# Patient Record
Sex: Female | Born: 1975 | Hispanic: No | Marital: Married | Smoking: Never smoker
Health system: Southern US, Community
[De-identification: ages and names within clinical notes are randomized; demographics above are authoritative.]

## PROBLEM LIST (undated history)

## (undated) DIAGNOSIS — D649 Anemia, unspecified: Secondary | ICD-10-CM

## (undated) HISTORY — DX: Anemia, unspecified: D64.9

---

## 1999-02-18 ENCOUNTER — Inpatient Hospital Stay (HOSPITAL_COMMUNITY): Admission: AD | Admit: 1999-02-18 | Discharge: 1999-02-18 | Payer: Self-pay | Admitting: Obstetrics & Gynecology

## 1999-03-02 ENCOUNTER — Ambulatory Visit (HOSPITAL_COMMUNITY): Admission: RE | Admit: 1999-03-02 | Discharge: 1999-03-02 | Payer: Self-pay | Admitting: Obstetrics and Gynecology

## 1999-07-04 ENCOUNTER — Inpatient Hospital Stay (HOSPITAL_COMMUNITY): Admission: AD | Admit: 1999-07-04 | Discharge: 1999-07-04 | Payer: Self-pay | Admitting: *Deleted

## 1999-07-04 ENCOUNTER — Inpatient Hospital Stay (HOSPITAL_COMMUNITY): Admission: AD | Admit: 1999-07-04 | Discharge: 1999-07-04 | Payer: Self-pay | Admitting: Obstetrics

## 1999-07-05 ENCOUNTER — Inpatient Hospital Stay (HOSPITAL_COMMUNITY): Admission: AD | Admit: 1999-07-05 | Discharge: 1999-07-09 | Payer: Self-pay | Admitting: Obstetrics

## 2002-02-08 ENCOUNTER — Encounter: Admission: RE | Admit: 2002-02-08 | Discharge: 2002-02-08 | Payer: Self-pay | Admitting: Family Medicine

## 2002-03-15 ENCOUNTER — Encounter: Admission: RE | Admit: 2002-03-15 | Discharge: 2002-03-15 | Payer: Self-pay | Admitting: Family Medicine

## 2002-05-03 ENCOUNTER — Encounter: Admission: RE | Admit: 2002-05-03 | Discharge: 2002-05-03 | Payer: Self-pay | Admitting: Family Medicine

## 2002-07-19 ENCOUNTER — Encounter: Admission: RE | Admit: 2002-07-19 | Discharge: 2002-07-19 | Payer: Self-pay | Admitting: Family Medicine

## 2002-08-24 ENCOUNTER — Encounter: Admission: RE | Admit: 2002-08-24 | Discharge: 2002-08-24 | Payer: Self-pay | Admitting: Family Medicine

## 2002-09-06 ENCOUNTER — Encounter: Admission: RE | Admit: 2002-09-06 | Discharge: 2002-09-06 | Payer: Self-pay | Admitting: Family Medicine

## 2002-10-11 ENCOUNTER — Encounter: Admission: RE | Admit: 2002-10-11 | Discharge: 2002-10-11 | Payer: Self-pay | Admitting: Family Medicine

## 2003-02-23 ENCOUNTER — Encounter: Admission: RE | Admit: 2003-02-23 | Discharge: 2003-02-23 | Payer: Self-pay | Admitting: Family Medicine

## 2003-03-07 ENCOUNTER — Encounter: Admission: RE | Admit: 2003-03-07 | Discharge: 2003-03-07 | Payer: Self-pay | Admitting: Family Medicine

## 2003-05-30 ENCOUNTER — Encounter: Admission: RE | Admit: 2003-05-30 | Discharge: 2003-05-30 | Payer: Self-pay | Admitting: Family Medicine

## 2003-09-05 ENCOUNTER — Encounter: Admission: RE | Admit: 2003-09-05 | Discharge: 2003-09-05 | Payer: Self-pay | Admitting: Family Medicine

## 2003-10-13 ENCOUNTER — Emergency Department (HOSPITAL_COMMUNITY): Admission: EM | Admit: 2003-10-13 | Discharge: 2003-10-13 | Payer: Self-pay | Admitting: Family Medicine

## 2003-10-18 ENCOUNTER — Emergency Department (HOSPITAL_COMMUNITY): Admission: EM | Admit: 2003-10-18 | Discharge: 2003-10-18 | Payer: Self-pay | Admitting: *Deleted

## 2004-01-13 ENCOUNTER — Ambulatory Visit: Payer: Self-pay | Admitting: Family Medicine

## 2004-01-16 ENCOUNTER — Encounter: Admission: RE | Admit: 2004-01-16 | Discharge: 2004-01-16 | Payer: Self-pay | Admitting: Sports Medicine

## 2004-02-02 ENCOUNTER — Ambulatory Visit: Payer: Self-pay | Admitting: Family Medicine

## 2004-09-03 ENCOUNTER — Ambulatory Visit: Payer: Self-pay | Admitting: Family Medicine

## 2005-01-31 ENCOUNTER — Ambulatory Visit: Payer: Self-pay | Admitting: Family Medicine

## 2005-08-30 ENCOUNTER — Encounter (INDEPENDENT_AMBULATORY_CARE_PROVIDER_SITE_OTHER): Payer: Self-pay | Admitting: *Deleted

## 2005-08-30 LAB — CONVERTED CEMR LAB

## 2005-09-16 ENCOUNTER — Ambulatory Visit: Payer: Self-pay | Admitting: Family Medicine

## 2005-12-16 ENCOUNTER — Ambulatory Visit: Payer: Self-pay | Admitting: Family Medicine

## 2005-12-30 ENCOUNTER — Ambulatory Visit: Payer: Self-pay | Admitting: Family Medicine

## 2006-02-07 ENCOUNTER — Ambulatory Visit: Payer: Self-pay | Admitting: Family Medicine

## 2006-05-30 ENCOUNTER — Encounter (INDEPENDENT_AMBULATORY_CARE_PROVIDER_SITE_OTHER): Payer: Self-pay | Admitting: *Deleted

## 2006-10-27 ENCOUNTER — Encounter (INDEPENDENT_AMBULATORY_CARE_PROVIDER_SITE_OTHER): Payer: Self-pay | Admitting: Family Medicine

## 2006-10-27 ENCOUNTER — Ambulatory Visit: Payer: Self-pay | Admitting: Family Medicine

## 2006-11-03 ENCOUNTER — Encounter (INDEPENDENT_AMBULATORY_CARE_PROVIDER_SITE_OTHER): Payer: Self-pay | Admitting: Family Medicine

## 2006-11-03 ENCOUNTER — Ambulatory Visit: Payer: Self-pay | Admitting: Sports Medicine

## 2006-11-03 LAB — CONVERTED CEMR LAB
ALT: 28 units/L (ref 0–35)
AST: 20 units/L (ref 0–37)
Albumin: 4.3 g/dL (ref 3.5–5.2)
Alkaline Phosphatase: 51 units/L (ref 39–117)
BUN: 13 mg/dL (ref 6–23)
CO2: 23 meq/L (ref 19–32)
Calcium: 8.9 mg/dL (ref 8.4–10.5)
Chloride: 106 meq/L (ref 96–112)
Cholesterol: 143 mg/dL (ref 0–200)
Creatinine, Ser: 0.65 mg/dL (ref 0.40–1.20)
Glucose, Bld: 89 mg/dL (ref 70–99)
HDL: 53 mg/dL (ref >39)
LDL Cholesterol: 76 mg/dL (ref 0–99)
Potassium: 4.4 meq/L (ref 3.5–5.3)
Sodium: 138 meq/L (ref 135–145)
Total Bilirubin: 0.6 mg/dL (ref 0.3–1.2)
Total CHOL/HDL Ratio: 2.7
Total Protein: 7.2 g/dL (ref 6.0–8.3)
Triglycerides: 68 mg/dL (ref <150)
VLDL: 14 mg/dL (ref 0–40)

## 2006-11-04 ENCOUNTER — Encounter (INDEPENDENT_AMBULATORY_CARE_PROVIDER_SITE_OTHER): Payer: Self-pay | Admitting: Family Medicine

## 2007-01-26 ENCOUNTER — Telehealth (INDEPENDENT_AMBULATORY_CARE_PROVIDER_SITE_OTHER): Payer: Self-pay | Admitting: *Deleted

## 2007-01-26 ENCOUNTER — Ambulatory Visit: Payer: Self-pay | Admitting: Sports Medicine

## 2008-01-13 ENCOUNTER — Encounter (INDEPENDENT_AMBULATORY_CARE_PROVIDER_SITE_OTHER): Payer: Self-pay | Admitting: Family Medicine

## 2008-02-01 ENCOUNTER — Encounter: Payer: Self-pay | Admitting: *Deleted

## 2008-05-09 ENCOUNTER — Encounter: Payer: Self-pay | Admitting: Family Medicine

## 2008-12-06 ENCOUNTER — Encounter: Payer: Self-pay | Admitting: Family Medicine

## 2008-12-06 ENCOUNTER — Ambulatory Visit: Payer: Self-pay | Admitting: Family Medicine

## 2008-12-06 LAB — CONVERTED CEMR LAB
Antibody Screen: NEGATIVE
Basophils Absolute: 0 10*3/uL (ref 0.0–0.1)
Basophils Relative: 0 % (ref 0–1)
Eosinophils Absolute: 0 10*3/uL (ref 0.0–0.7)
Eosinophils Relative: 1 % (ref 0–5)
HCT: 35.8 % — ABNORMAL LOW (ref 36.0–46.0)
Hemoglobin: 12.1 g/dL (ref 12.0–15.0)
Hepatitis B Surface Ag: NEGATIVE
Lymphocytes Relative: 22 % (ref 12–46)
Lymphs Abs: 1.7 10*3/uL (ref 0.7–4.0)
MCHC: 33.8 g/dL (ref 30.0–36.0)
MCV: 84 fL (ref 78.0–100.0)
Monocytes Absolute: 0.3 10*3/uL (ref 0.1–1.0)
Monocytes Relative: 4 % (ref 3–12)
Neutro Abs: 5.9 10*3/uL (ref 1.7–7.7)
Neutrophils Relative %: 74 % (ref 43–77)
Platelets: 197 10*3/uL (ref 150–400)
RBC: 4.26 M/uL (ref 3.87–5.11)
RDW: 13.6 % (ref 11.5–15.5)
Rh Type: POSITIVE
Rubella: 413.1 intl units/mL — ABNORMAL HIGH
Sickle Cell Screen: NEGATIVE
WBC: 8 10*3/uL (ref 4.0–10.5)

## 2008-12-07 ENCOUNTER — Encounter: Payer: Self-pay | Admitting: Family Medicine

## 2008-12-12 ENCOUNTER — Encounter: Payer: Self-pay | Admitting: Family Medicine

## 2008-12-12 ENCOUNTER — Ambulatory Visit: Payer: Self-pay | Admitting: Family Medicine

## 2008-12-12 LAB — CONVERTED CEMR LAB
Chlamydia, DNA Probe: NEGATIVE
GC Probe Amp, Genital: NEGATIVE

## 2008-12-14 ENCOUNTER — Encounter: Payer: Self-pay | Admitting: Family Medicine

## 2008-12-14 DIAGNOSIS — F330 Major depressive disorder, recurrent, mild: Secondary | ICD-10-CM

## 2008-12-28 ENCOUNTER — Ambulatory Visit: Payer: Self-pay | Admitting: Family Medicine

## 2008-12-30 ENCOUNTER — Telehealth: Payer: Self-pay | Admitting: Family Medicine

## 2009-01-04 ENCOUNTER — Encounter: Payer: Self-pay | Admitting: Family Medicine

## 2009-01-04 ENCOUNTER — Ambulatory Visit: Payer: Self-pay | Admitting: Family Medicine

## 2009-01-06 ENCOUNTER — Telehealth: Payer: Self-pay | Admitting: *Deleted

## 2009-01-09 ENCOUNTER — Telehealth: Payer: Self-pay | Admitting: *Deleted

## 2009-01-09 ENCOUNTER — Encounter: Payer: Self-pay | Admitting: Family Medicine

## 2009-01-23 ENCOUNTER — Ambulatory Visit: Payer: Self-pay | Admitting: Family Medicine

## 2009-01-23 ENCOUNTER — Encounter: Payer: Self-pay | Admitting: Family Medicine

## 2009-01-23 LAB — CONVERTED CEMR LAB
Blood in Urine, dipstick: NEGATIVE
Chlamydia, DNA Probe: NEGATIVE
Ketones, urine, test strip: NEGATIVE
Protein, U semiquant: NEGATIVE
Urobilinogen, UA: 0.2
WBC Urine, dipstick: NEGATIVE
Whiff Test: NEGATIVE

## 2009-01-24 ENCOUNTER — Encounter: Payer: Self-pay | Admitting: Family Medicine

## 2009-02-01 ENCOUNTER — Encounter: Payer: Self-pay | Admitting: Family Medicine

## 2009-02-09 ENCOUNTER — Encounter: Payer: Self-pay | Admitting: Family Medicine

## 2009-02-22 ENCOUNTER — Ambulatory Visit: Payer: Self-pay | Admitting: Family Medicine

## 2009-03-01 ENCOUNTER — Ambulatory Visit (HOSPITAL_COMMUNITY): Admission: RE | Admit: 2009-03-01 | Discharge: 2009-03-01 | Payer: Self-pay | Admitting: Family Medicine

## 2009-03-17 ENCOUNTER — Encounter: Payer: Self-pay | Admitting: Family Medicine

## 2009-03-21 ENCOUNTER — Ambulatory Visit: Payer: Self-pay | Admitting: Family Medicine

## 2009-04-10 ENCOUNTER — Encounter: Payer: Self-pay | Admitting: Family Medicine

## 2009-04-10 ENCOUNTER — Ambulatory Visit: Payer: Self-pay | Admitting: Family Medicine

## 2009-04-10 LAB — CONVERTED CEMR LAB
Hemoglobin: 11.4 g/dL — ABNORMAL LOW (ref 12.0–15.0)
RBC: 3.84 M/uL — ABNORMAL LOW (ref 3.87–5.11)
WBC: 11 10*3/uL — ABNORMAL HIGH (ref 4.0–10.5)

## 2009-04-20 ENCOUNTER — Ambulatory Visit: Payer: Self-pay | Admitting: Family Medicine

## 2009-05-04 ENCOUNTER — Ambulatory Visit: Payer: Self-pay | Admitting: Family Medicine

## 2009-05-17 ENCOUNTER — Ambulatory Visit: Payer: Self-pay | Admitting: Family Medicine

## 2009-05-17 LAB — CONVERTED CEMR LAB: Whiff Test: NEGATIVE

## 2009-05-31 ENCOUNTER — Encounter: Payer: Self-pay | Admitting: Family Medicine

## 2009-05-31 ENCOUNTER — Ambulatory Visit: Payer: Self-pay | Admitting: Family Medicine

## 2009-06-02 ENCOUNTER — Encounter: Payer: Self-pay | Admitting: *Deleted

## 2009-06-05 ENCOUNTER — Emergency Department (HOSPITAL_COMMUNITY): Admission: EM | Admit: 2009-06-05 | Discharge: 2009-06-05 | Payer: Self-pay | Admitting: Emergency Medicine

## 2009-06-07 ENCOUNTER — Ambulatory Visit: Payer: Self-pay

## 2009-06-07 ENCOUNTER — Encounter: Payer: Self-pay | Admitting: Family Medicine

## 2009-06-14 ENCOUNTER — Ambulatory Visit: Payer: Self-pay | Admitting: Family Medicine

## 2009-06-21 ENCOUNTER — Ambulatory Visit: Payer: Self-pay | Admitting: Family Medicine

## 2009-06-28 ENCOUNTER — Ambulatory Visit: Payer: Self-pay | Admitting: Family Medicine

## 2009-06-28 ENCOUNTER — Encounter: Payer: Self-pay | Admitting: Family Medicine

## 2009-06-29 ENCOUNTER — Ambulatory Visit: Payer: Self-pay | Admitting: Family Medicine

## 2009-06-29 ENCOUNTER — Inpatient Hospital Stay (HOSPITAL_COMMUNITY): Admission: AD | Admit: 2009-06-29 | Discharge: 2009-07-01 | Payer: Self-pay | Admitting: Obstetrics & Gynecology

## 2009-08-09 ENCOUNTER — Other Ambulatory Visit: Admission: RE | Admit: 2009-08-09 | Discharge: 2009-08-09 | Payer: Self-pay | Admitting: Family Medicine

## 2009-08-09 ENCOUNTER — Encounter: Payer: Self-pay | Admitting: Family Medicine

## 2009-08-09 ENCOUNTER — Ambulatory Visit: Payer: Self-pay | Admitting: Family Medicine

## 2009-08-09 LAB — CONVERTED CEMR LAB
Platelets: 245 10*3/uL (ref 150–400)
WBC: 8.2 10*3/uL (ref 4.0–10.5)

## 2009-08-10 LAB — CONVERTED CEMR LAB: Pap Smear: NEGATIVE

## 2009-11-17 ENCOUNTER — Emergency Department (HOSPITAL_COMMUNITY): Admission: EM | Admit: 2009-11-17 | Discharge: 2009-11-17 | Payer: Self-pay | Admitting: Family Medicine

## 2009-11-21 ENCOUNTER — Ambulatory Visit: Payer: Self-pay | Admitting: Family Medicine

## 2009-12-11 ENCOUNTER — Telehealth: Payer: Self-pay | Admitting: Family Medicine

## 2009-12-14 ENCOUNTER — Ambulatory Visit: Payer: Self-pay | Admitting: Family Medicine

## 2009-12-14 DIAGNOSIS — N949 Unspecified condition associated with female genital organs and menstrual cycle: Secondary | ICD-10-CM

## 2009-12-14 LAB — CONVERTED CEMR LAB
Beta hcg, urine, semiquantitative: NEGATIVE
Hemoglobin: 11.7 g/dL

## 2009-12-26 ENCOUNTER — Telehealth: Payer: Self-pay | Admitting: *Deleted

## 2010-05-01 NOTE — Assessment & Plan Note (Signed)
Summary: ob visit/eo: 37 5/7   Vital Signs:  Patient profile:   35 year old female Weight:      145.44 pounds BP sitting:   108 / 72  (left arm)  Vitals Entered ByAngeline Slim MD (June 14, 2009 3:10 PM) CC: 37 5/7 OB check Is Patient Diabetic? No Pain Assessment Patient in pain? no        Primary Care Provider:  Dasia Guerrier MD  CC:  37 5/7 OB check.  History of Present Illness: 35 y/o G3P1011 at 88 5/7 wga by LMP and 19 wga U/S with [facility] 06/30/09.   U/S at 49 wga showed that EFW is 41%, with normal placenta.   Habits & Providers  Alcohol-Tobacco-Diet     Tobacco Status: never     Cigarette Packs/Day: no  Allergies: No Known Drug Allergies   Impression & Recommendations:  Problem # 1:  PREGNANCY, NORMAL (ICD-V22.2) Assessment Unchanged  35 y/o G3P1011 at 9 5/7 wga by LMP and 19 wga U/S with [facility] 06/30/09.   U/S at 7 wga showed that EFW is 41%, with normal placenta. Birth control: pt thinks either Depo.  No Epidural.  No IV pain meds.  Baby will be followed by [facility] since her other daughter, Steward Drone, is seen by them.  Instructions given for going to MAU.    Pt to be seen in 1 wks with me or Dr Gwendolyn Grant.  Records faxed to [facility] with GBS negative.  Cervix: thick/closed/high vertex.  Ultrasound for position: vertex.   Labs:  A+/Ab neg, [place] neg, HIV NR, Hep B neg, RPR NR, Rubella IMM, 12/06/08 H/H 12.1/35.8, 12/12/08  GC/Chlam neg.  Early 1hr glucola (01/04/09) 145, 3 hr glucola 83, 98, 106, 114.  Repeat 28 wk 3-hr glucola 03/10/10 86 (fasting), 164, 161, 104.  GBS Negabie 05/31/09.  Orders: Other OB visit- [facility] ([facility]BCK)  Complete Medication List: 1)  P D Natal Vitamins/folic Acid Tabs (Prenatal multivit-min-fe-fa) .... One tablet by mouth daily. please label in spanish 2)  Ranitidine Hcl 150 Mg Caps (Ranitidine hcl) .... One tablet by mouth daily for heartburn 3)  Ondansetron Hcl 4 Mg Tabs (Ondansetron hcl) .... One tablet by mouth every 8 hours for nausea 4)  Unisom 25 Mg Tabs (Doxylamine  succinate (sleep)) .... One tablet by mouth three times a day for nausea.  please label in spanish 5)  Vitamin B-6 100 Mg Tabs (Pyridoxine hcl) .... One tablet by mouth three times a day for nausea.  take with unisom.  please label in spanish  Patient Instructions: 1)  Please schedule a follow-up appointment in 1 week for OB  2)  Dr Mack Hook pager [phone].  If you are admitted to [facility] hospital please ask the nurse to call me. 3)  Kick Counts:  If the baby is not moving as much as normal, then count for 1 hr.  If less than 5 movements in one hour, then drink water, lay down for 30 minutes.  Then count for 2 hrs, should move at least 10 times.  If not go to [facility] hospital. 4)  Go to [facility] hospital for bleeding or water breaking.  5)  I want you to drink plenty of fluids.     Flowsheet View for Follow-up Visit    Estimated weeks of       gestation:     37 5/7    Weight:     145.44    Blood pressure:   108 / 72  Hx headache?     No    Nausea/vomiting?   No    Edema?     0    Bleeding?     no    Leakage/discharge?   no    Fetal activity:       yes    Labor symptoms?   few ctx    Fundal height:      37    FHR:       140s    Fetal position:      vertex    Cx dilation:     0    Cx effacement:   0    Fetal station:     high    Taking Vitamins?   Y    Smoking PPD:   no    Comment:     some nausea, started yesterday, some back pain.     Next visit:     1 wk    Resident:     Pope Brunty    Preceptor:     Sheffield Slider t

## 2010-05-01 NOTE — Assessment & Plan Note (Signed)
Summary: OB F/U [facility]  29 6/7   Vital Signs:  Patient profile:   35 year old female Height:      61.75 inches Weight:      133 pounds BMI:     24.61 Pulse rate:   94 / minute BP sitting:   108 / 69  (left arm) Cuff size:   regular  Vitals Entered By: Tessie Fass CMA (April 20, 2009 2:05 PM) CC: OB visit   Primary Care Provider:  Trung Wenzl MD  CC:  OB visit.  History of Present Illness: 35 y/o G3P1011 at 45 6/7 wga by [facility] and 93 wga [place] with [facility] 06/30/09.    Habits & Providers  Alcohol-Tobacco-Diet     Cigarette Packs/Day: no  Medications Prior to Update: 1)  P D Natal Vitamins/folic Acid  Tabs (Prenatal Multivit-Min-Fe-Fa) .... One Tablet By Mouth Daily. Please Label in Spanish 2)  Ranitidine Hcl 150 Mg Caps (Ranitidine Hcl) .... One Tablet By Mouth Daily For Heartburn 3)  Ondansetron Hcl 4 Mg Tabs (Ondansetron Hcl) .... One Tablet By Mouth Every 8 Hours For Nausea 4)  Unisom 25 Mg Tabs (Doxylamine Succinate (Sleep)) .... One Tablet By Mouth Three Times A Day For Nausea.  Please Label in Spanish 5)  Vitamin B-6 100 Mg Tabs (Pyridoxine Hcl) .... One Tablet By Mouth Three Times A Day For Nausea.  Take With Unisom.  Please Label in Spanish  Current Medications (verified): 1)  P D Natal Vitamins/folic Acid  Tabs (Prenatal Multivit-Min-Fe-Fa) .... One Tablet By Mouth Daily. Please Label in Spanish 2)  Ranitidine Hcl 150 Mg Caps (Ranitidine Hcl) .... One Tablet By Mouth Daily For Heartburn 3)  Ondansetron Hcl 4 Mg Tabs (Ondansetron Hcl) .... One Tablet By Mouth Every 8 Hours For Nausea 4)  Unisom 25 Mg Tabs (Doxylamine Succinate (Sleep)) .... One Tablet By Mouth Three Times A Day For Nausea.  Please Label in Spanish 5)  Vitamin B-6 100 Mg Tabs (Pyridoxine Hcl) .... One Tablet By Mouth Three Times A Day For Nausea.  Take With Unisom.  Please Label in Spanish  Allergies (verified): No Known Drug Allergies  Past History:  Past Medical History: Last updated: 10/27/2006 Breast lump  - 611.72 - now gone   Past Surgical History: Last updated: 10/27/2006 IUD Mirena - 08/31/2002, partial removal ingrown toe nail - 03/10/2003, TAB - 05/02/2002  Family History: Last updated: 12/12/2008 Father - 87 yo, prostate disease (not CA) otherwise well,  Mother - deceased 16 yo, TB, hepatitis Brothers- DM, brother  Social History: lives with the father of her daughter([name]) born 4/05.; Native of [place]; Boyfriend is a Corporate investment banker. Works at [employer] at [employer]; No etoh, no tob.no drugs.    Impression & Recommendations:  Problem # 1:  PREGNANCY, NORMAL (ICD-V22.2) Assessment Unchanged  35 y/o G3P1011 at 63 6/7 wga by LMP and 19 wga U/S with [facility] 06/30/08.   U/S at 86 wga showed that EFW is 41%, with normal placenta.  Pt has not decided about birth control.  No Epidural.  No IV pain meds.  Baby will be followed by [facility] since her other daughter, Steward Drone, is seen by them.  Pt to be seen in [facility] clinic or with me next 1-2 wks.    Labs:  A+/Ab neg, [place] neg, HIV NR, Hep B neg, RPR NR, Rubella IMM, 12/06/08 H/H 12.1/35.8, 12/12/08  GC/Chlam neg.  Early 1hr glucola (01/04/09) 145, 3 hr glucola 83, 98, 106, 114.  Repeat 28  wk 3-hr glucola 03/10/10 86 (fasting), 164, 161, 104  Orders: Other OB visit- [facility] (OBCK)  Complete Medication List: 1)  P D Natal Vitamins/folic Acid Tabs (Prenatal multivit-min-fe-fa) .... One tablet by mouth daily. please label in spanish 2)  Ranitidine Hcl 150 Mg Caps (Ranitidine hcl) .... One tablet by mouth daily for heartburn 3)  Ondansetron Hcl 4 Mg Tabs (Ondansetron hcl) .... One tablet by mouth every 8 hours for nausea 4)  Unisom 25 Mg Tabs (Doxylamine succinate (sleep)) .... One tablet by mouth three times a day for nausea.  please label in spanish 5)  Vitamin B-6 100 Mg Tabs (Pyridoxine hcl) .... One tablet by mouth three times a day for nausea.  take with unisom.  please label in spanish  Patient Instructions: 1)  Please schedule a follow-up  appointment in 2 weeks with OB clinic or Dr Janalyn Harder.  2)  Dr Mack Hook pager [phone].  If you are admitted to [facility] hospital please ask the nurse to call me. 3)  Kick Counts:  If the baby is not moving as much as normal, then count for 1 hr.  If less than 5 movements in one hour, then drink water, lay down for 30 minutes.  Then count for 2 hrs, should move at least 10 times.  If not go to [facility] hospital. 4)  Go to [facility] hospital for bleeding or water breaking.  5)  I want you to drink plenty of fluids.   Prescriptions: P D NATAL VITAMINS/FOLIC ACID  TABS (PRENATAL MULTIVIT-MIN-FE-FA) one tablet by mouth daily. please label in Spanish  #34 x 12   Entered and Authorized by:   Angeline Slim MD   Signed by:   Angeline Slim MD on 04/20/2009   Method used:   Electronically to        [facility] [id number]* (retail)       [address]       [place], [name]  [address]       Ph: [phone]       Fax: [phone]   RxID:   [id number]    OB Initial Intake Information    Positive HCG by: self, health dept    Race: Other    Marital status: Married    Occupation: outside work    Type of work: Chief Technology Officer, 4 days/wk    Education (last grade completed): 12    Number of children at home: 1    [facility]ospital [facility] delivery[facility]    Newborn's physician: [facility] [address]  FOB Information    Husband/Father of baby: Drucilla Chalet    FOB occupation Holiday representative    Phone: [phone]    FOB Comments: FOB is not husband, but in stable relationship.  Menstrual History    LMP (date): 09/23/2008    LMP - Character: normal    Menarche: 16 years    Menses interval: 30 days    Menstrual flow 4-5 days    On BCP's at conception: no    Date of positive (+) home preg. test: 11/04/2008   Flowsheet View for Follow-up Visit    Estimated weeks of       gestation:     29 6/7    Weight:     133    Blood pressure:   108 / 69    Headache:     No    Nausea/vomiting:   No    Edema:     0    Vaginal  bleeding:   no    Vaginal discharge:   no    Fundal height:      27    FHR:       150s    Fetal activity:     yes    Labor symptoms:   no    Taking prenatal vits?   Y    Smoking:     no    Next visit:     2 wk    Resident:     Makiya Jeune    Preceptor:     Mauricio Po    Comment:     OB clinic next week, then to see me again.    Flowsheet View for Follow-up Visit    Estimated weeks of       gestation:     29 6/7    Weight:     133    Blood pressure:   108 / 69    Hx headache?     No    Nausea/vomiting?   No    Edema?     0    Bleeding?     no    Leakage/discharge?   no    Fetal activity:       yes    Labor symptoms?   no    Fundal height:      27    FHR:       150s    Taking Vitamins?   Y    Smoking PPD:   no    Comment:     OB clinic next week, then to see me again.    Next visit:     2 wk    Resident:     Benjamyn Hestand    Preceptor:     Montel Culver View for Follow-up Visit    Estimated weeks of       gestation:     29 6/7    Weight:     133    Blood pressure:   108 / 69    Hx headache?     No    Nausea/vomiting?   No    Edema?     0    Bleeding?     no    Leakage/discharge?   no    Fetal activity:       yes    Labor symptoms?   no    Fundal height:      27    FHR:       150s    Taking Vitamins?   Y    Smoking PPD:   no    Comment:     OB clinic next week, then to see me again.    Next visit:     2 wk    Resident:     Jossie Smoot    Preceptor:     Mauricio Po

## 2010-05-01 NOTE — Assessment & Plan Note (Signed)
Summary: ob visit/eo: 35 5/7   Vital Signs:  Patient profile:   35 year old female Weight:      141 pounds Pulse rate:   86 / minute BP sitting:   114 / 74  (left arm) Cuff size:   regular  Vitals Entered By: Tessie Fass CMA (May 31, 2009 2:56 PM) CC: OB Visit   Primary Care Provider:  Angeline Slim MD  CC:  OB Visit.  History of Present Illness: 35 y/o G3P1011 at 52 5/7 wga with [facility] 06/30/09.   Habits & Providers  Alcohol-Tobacco-Diet     Cigarette Packs/Day: no  Allergies: No Known Drug Allergies  Past History:  Past Medical History: Last updated: 10/27/2006 Breast lump - 611.72 - now gone   Past Surgical History: Last updated: 10/27/2006 IUD Mirena - 08/31/2002, partial removal ingrown toe nail - 03/10/2003, TAB - 05/02/2002  Family History: Last updated: 12/12/2008 Father - 92 yo, prostate disease (not CA) otherwise well,  Mother - deceased 87 yo, TB, hepatitis Brothers- DM, brother  Social History: Last updated: 04/20/2009 lives with the father of her daughter([name]) born 4/05.; Native of [place]; Boyfriend is a Corporate investment banker. Works at [employer] at [employer]; No etoh, no tob.no drugs.    Impression & Recommendations:  Problem # 1:  PREGNANCY, NORMAL (ICD-V22.2) Assessment Unchanged 35 y/o G3P1011 at 37 5/7 wga by LMP and 19 wga U/S with [facility] 06/30/09.   U/S at 71 wga showed that EFW is 41%, with normal placenta. Birth control: pt thinks either Depo or IUD.  No Epidural.  No IV pain meds.  Baby will be followed by [facility] since her other daughter, Steward Drone, is seen by them. GBS done today.  Did not repeat CG/Chlam as pt had friable cervix previously and I think her risks are low.  Instructions given for going to MAU.  Will try ranitidine for reflux symptioms.    Pt to be seen in 1 wks with me or Dr Gwendolyn Grant.  Will fax records to [facility] once we have GBS result.  Labs:  A+/Ab neg, [place] neg, HIV NR, Hep B neg, RPR NR, Rubella IMM, 12/06/08 H/H 12.1/35.8, 12/12/08   GC/Chlam neg.  Early 1hr glucola (01/04/09) 145, 3 hr glucola 83, 98, 106, 114.  Repeat 28 wk 3-hr glucola 03/10/10 86 (fasting), 164, 161, 104  Orders: Grp B Probe-[facility] ([id number]) Other OB visit- [facility] (OBCK)  Complete Medication List: 1)  P D Natal Vitamins/folic Acid Tabs (Prenatal multivit-min-fe-fa) .... One tablet by mouth daily. please label in spanish 2)  Ranitidine Hcl 150 Mg Caps (Ranitidine hcl) .... One tablet by mouth daily for heartburn 3)  Ondansetron Hcl 4 Mg Tabs (Ondansetron hcl) .... One tablet by mouth every 8 hours for nausea 4)  Unisom 25 Mg Tabs (Doxylamine succinate (sleep)) .... One tablet by mouth three times a day for nausea.  please label in spanish 5)  Vitamin B-6 100 Mg Tabs (Pyridoxine hcl) .... One tablet by mouth three times a day for nausea.  take with unisom.  please label in spanish  Patient Instructions: 1)  Please schedule a follow-up appointment in 1 week with Dr Janalyn Harder or Dr Gwendolyn Grant.  2)  Dr Mack Hook pager [phone].  If you are admitted to [facility] hospital please ask the nurse to call me. 3)  Kick Counts:  If the baby is not moving as much as normal, then count for 1 hr.  If less than 5 movements in one hour, then drink water,  lay down for 30 minutes.  Then count for 2 hrs, should move at least 10 times.  If not go to [facility] hospital. 4)  Go to [facility] hospital for bleeding or water breaking.  5)  I want you to drink plenty of fluids.   6)  Take Ranitidine for reflux.     OB Initial Intake Information    Positive HCG by: self, health dept    Race: Other    Marital status: Married    Occupation: outside work    Type of work: Chief Technology Officer, 4 days/wk    Education (last grade completed): 12    Number of children at home: 1    [facility] delivery: [facility]    Newborn's physician: [facility] [address]  FOB Information    Husband/Father of baby: Drucilla Chalet    FOB occupation Holiday representative    Phone: [phone]    FOB Comments: FOB is not  husband, but in stable relationship.  Menstrual History    LMP (date): 09/23/2008    LMP - Character: normal    Menarche: 16 years    Menses interval: 30 days    Menstrual flow 4-5 days    On BCP's at conception: no    Date of positive (+) home preg. test: 11/04/2008   Flowsheet View for Follow-up Visit    Estimated weeks of       gestation:     35 5/7    Weight:     141    Blood pressure:   114 / 74    Headache:     No    Nausea/vomiting:   No    Edema:     TrLE    Vaginal bleeding:   no    Vaginal discharge:   some    Fundal height:      34    FHR:       140s    Fetal activity:     yes    Labor symptoms:   no    Taking prenatal vits?   Y    Smoking:     no    Next visit:     1 wk    Resident:     Chamberlain Steinborn    Preceptor:     [place]    Comment:     lots of acid reflux. has not tried taking ranitidine.     Flowsheet View for Follow-up Visit    Estimated weeks of       gestation:     35 5/7    Weight:     141    Blood pressure:   114 / 74    Hx headache?     No    Nausea/vomiting?   No    Edema?     TrLE    Bleeding?     no    Leakage/discharge?   some    Fetal activity:       yes    Labor symptoms?   no    Fundal height:      34    FHR:       140s    Taking Vitamins?   Y    Smoking PPD:   no    Comment:     lots of acid reflux. has not tried taking ranitidine.     Next visit:     1 wk    Resident:     Jamilyn Pigeon  Preceptor:     Pearletha Forge

## 2010-05-01 NOTE — Assessment & Plan Note (Signed)
Summary: OB/KH   Vital Signs:  Patient profile:   35 year old female Weight:      147.2 pounds BP sitting:   128 / 82  Vitals Entered By: Temple Pacini  Primary Care Provider:  Angeline Slim MD   History of Present Illness: 35 y/o G3P1011 at 22 5/7 wga by LMP and 19 wga U/S with [facility] 06/30/09.    BPP + NST set up for 07/03/09.  Habits & Providers  Alcohol-Tobacco-Diet     Cigarette Packs/Day: ni  Allergies: No Known Drug Allergies  Social History: Packs/Day:  ni   Impression & Recommendations:  Problem # 1:  PREGNANCY, NORMAL (ICD-V22.2) Assessment Unchanged 35 y/o G3P1011 at 24 5/7 wga by LMP and 19 wga U/S with [facility] 06/30/09.   U/S at 23 wga showed that EFW is 41%, with normal placenta. Birth control: pt thinks either Depo.  No Epidural.  No IV pain meds.  Baby will be followed by [facility] since her other daughter, Steward Drone, is seen by them.  Instructions given for going to [facility]U.    BPP + NST set up for 07/05/09 (no appts avail sooner due to wknd).  Will set up date for induction at 25 wga if still pregnant.   Pt to be seen in 1 wks with me or Dr Gwendolyn Grant.  Records faxed to [facility] with GBS negative.  Cervix: 1.5/30%/-3/vertex.  Ultrasound for position: vertex.   Labs:  A+/Ab neg, [place] neg, HIV NR, Hep B neg, RPR NR, Rubella IMM, 12/06/08 H/H 12.1/35.8, 12/12/08  GC/Chlam neg.  Early 1hr glucola (01/04/09) 145, 3 hr glucola 83, 98, 106, 114.  Repeat 28 wk 3-hr glucola 03/10/10 86 (fasting), 164, 161, 104.  GBS Negative 05/31/09.  Orders: Other OB visit- [facility] (OBCK)  Complete Medication List: 1)  P D Natal Vitamins/folic Acid Tabs (Prenatal multivit-min-fe-fa) .... One tablet by mouth daily. please label in spanish 2)  Ranitidine Hcl 150 Mg Caps (Ranitidine hcl) .... One tablet by mouth daily for heartburn 3)  Ondansetron Hcl 4 Mg Tabs (Ondansetron hcl) .... One tablet by mouth every 8 hours for nausea 4)  Unisom 25 Mg Tabs (Doxylamine succinate (sleep)) .... One tablet by mouth three times a day for  nausea.  please label in spanish 5)  Vitamin B-6 100 Mg Tabs (Pyridoxine hcl) .... One tablet by mouth three times a day for nausea.  take with unisom.  please label in spanish  Patient Instructions: 1)  Please schedule a follow-up appointment in 1 weeks if still pregnant.  2)  Dr Mack Hook pager [phone].  If you are admitted to [facility] hospital please ask the nurse to call me. 3)  Kick Counts:  If the baby is not moving as much as normal, then count for 1 hr.  If less than 5 movements in one hour, then drink water, lay down for 30 minutes.  Then count for 2 hrs, should move at least 10 times.  If not go to [facility] hospital. 4)  Go to [facility] hospital for bleeding or water breaking.  5)  I want you to drink plenty of fluids.   6)  Keep appointment on 4/6 for ultrasound.   Flowsheet View for Follow-up Visit    Estimated weeks of       gestation:     39 5/7    Weight:     147.2    Blood pressure:   128 / 82    Hx headache?     No  Nausea/vomiting?   No    Edema?     TrLE    Bleeding?     no    Leakage/discharge?   no    Fetal activity:       yes    Labor symptoms?   few ctx    Fundal height:      38.5    FHR:       140s    Fetal position:      vertex    Cx dilation:     1.5    Cx effacement:   30%    Fetal station:     -3    Taking Vitamins?   Y    Smoking PPD:   ni    Comment:     +crampy abd pain. loss of appetite.  back pain.     Next visit:     1 wk    Resident:     Mahitha Hickling    Preceptor:     Pearletha Forge

## 2010-05-01 NOTE — Assessment & Plan Note (Signed)
Summary: menses concerns & needs contraception/[place]/ta   Vital Signs:  Patient profile:   35 year old female Height:      61.75 inches Weight:      120 pounds BMI:     22.21 Temp:     97.8 degrees F oral Pulse rate:   69 / minute BP sitting:   100 / 66  (left arm) Cuff size:   regular  Vitals Entered By: Tessie Fass CMA (December 14, 2009 4:17 PM) CC: irregular menses Pain Assessment Patient in pain? no        Primary Care Chauntelle Azpeitia:  Cat Ta MD  CC:  irregular menses.  History of Present Illness: 35 y/o G3P2012 presents for irregular menses.   She delivered her baby on March 31 and received depo injection prior to hospital discharge.  She has been breastfeeding and not using any other forms of birth control.  She has not had a menstrual cycle until mid Aug and is still bleeding (almost 4 wks).  States that cycle is light now.  She is changing pads only 2x/day.  No fever, chills, abd pain, vaginal pain, pain with intercourse, weakness, dysnpea, abnormal weight loss.  (lost 4 lbs in last 5 months).    Current Medications (verified): 1)  P D Natal Vitamins/folic Acid  Tabs (Prenatal Multivit-Min-Fe-Fa) .... One Tablet By Mouth Daily. Please Label in Spanish 2)  Premarin 1.25 Mg Tabs (Estrogens Conjugated) .Marland Kitchen.. 1 Tab By Mouth Daily X 7 Days  Allergies (verified): No Known Drug Allergies  Review of Systems       per hpi   Physical Exam  General:  Well-developed,well-nourished,in no acute distress; alert,appropriate and cooperative throughout examination Genitalia:  Normal introitus for age, no external lesions, no vaginal discharge, mucosa pink and moist, no vaginal or cervical lesions, no vaginal atrophy, no friaility or hemorrhage, normal uterus size and position, no adnexal masses or tenderness. Minimal bleeding in vault.  no inflammation.    Impression & Recommendations:  Problem # 1:  MENORRHAGIA, PERIMENOPAUSAL (ICD-626.8) Assessment New Hb 11.7.  Pt has no  complaints of fatigue, sob, dizziness, lightheadedness.  Exam wnl.  Irregular menses likely from Depo.  Pt has not had another injection since April.  We discussed treatment of options: do nothing, or stop the bleeding with oral hormone.  Pt states that she will wait another weeka and if still bleeding will take med.  Will rx Premarin 1.25mg  daily x 7 days (rec by Uptodate as pt had Depo).  Orders: Hemoglobin-[facility] ([id number]) [facility]- Est Level  3 ([id number])  Complete Medication List: 1)  P D Natal Vitamins/folic Acid Tabs (Prenatal multivit-min-fe-fa) .... One tablet by mouth daily. please label in spanish 2)  Premarin 1.25 Mg Tabs (Estrogens conjugated) .Marland Kitchen.. 1 tab by mouth daily x 7 days  Other Orders: U Preg-[facility] ([id number])  Patient Instructions: 1)  Premarin once a day for 7 days to stop the bleeding.  Prescriptions: PREMARIN 1.25 MG TABS (ESTROGENS CONJUGATED) 1 tab by mouth daily x 7 days  #7 x 0   Entered and Authorized by:   Angeline Slim MD   Signed by:   Angeline Slim MD on 12/14/2009   Method used:   Electronically to        [facility] [phone]* (retail)       [address]       [place], [name]  [address]       Ph: [phone]       Fax: [phone]  RxID:   [id number]   Laboratory Results   Urine Tests  Date/Time Received: December 14, 2009 4:24 PM  Date/Time Reported: December 14, 2009 4:29 PM     Urine HCG: negative Comments: ...........test performed by...........Marland KitchenTerese Door, CMA   Blood Tests   Date/Time Received: December 14, 2009 4:58 PM   Date/Time Reported: December 14, 2009 5:00 PM     CBC   HGB:  11.7 g/dL   (Normal Range: 14.7-82.9 in Males, 12.0-15.0 in Females) Comments: ...........test performed by...........Marland KitchenTerese Door, CMA

## 2010-05-01 NOTE — Assessment & Plan Note (Signed)
Summary: OB F/U [facility]: 38 5/7   Vital Signs:  Patient profile:   35 year old female Height:      61.75 inches Weight:      145 pounds Pulse rate:   83 / minute BP sitting:   124 / 77  (left arm) Cuff size:   regular  Vitals Entered By: Tessie Fass CMA (June 21, 2009 1:48 PM) CC: OB visit   Primary Care Provider:  Rigel Filsinger MD  CC:  OB visit.  History of Present Illness: 35 y/o G3P1011 at 37 5/7 wga by LMP and 19 wga U/S with [facility] 06/30/09.   U/S at 37 wga showed that EFW is 41%, with normal placenta.  Habits & Providers  Alcohol-Tobacco-Diet     Cigarette Packs/Day: no  Allergies: No Known Drug Allergies   Impression & Recommendations:  Problem # 1:  PREGNANCY, NORMAL (ICD-V22.2) Assessment Unchanged  35 y/o G3P1011 at 77 5/7 wga by LMP and 19 wga U/S with [facility] 06/30/09.   U/S at 63 wga showed that EFW is 41%, with normal placenta. Birth control: pt thinks either Depo.  No Epidural.  No IV pain meds.  Baby will be followed by [facility] since her other daughter, Steward Drone, is seen by them.  Instructions given for going to [facility].    Pt to be seen in 1 wks with me or Dr Gwendolyn Grant.  Records faxed to [facility] with GBS negative.  Cervix: thick/closed/high vertex.  Ultrasound for position: vertex.   Labs:  A+/Ab neg, [place] neg, HIV NR, Hep B neg, RPR NR, Rubella IMM, 12/06/08 H/H 12.1/35.8, 12/12/08  GC/Chlam neg.  Early 1hr glucola (01/04/09) 145, 3 hr glucola 83, 98, 106, 114.  Repeat 28 wk 3-hr glucola 03/10/10 86 (fasting), 164, 161, 104.  GBS [place] 05/31/09.  Orders: Other OB visit- [facility] ([facility]K)  Complete Medication List: 1)  P D Natal Vitamins/folic Acid Tabs (Prenatal multivit-min-fe-fa) .... One tablet by mouth daily. please label in spanish 2)  Ranitidine Hcl 150 Mg Caps (Ranitidine hcl) .... One tablet by mouth daily for heartburn 3)  Ondansetron Hcl 4 Mg Tabs (Ondansetron hcl) .... One tablet by mouth every 8 hours for nausea 4)  Unisom 25 Mg Tabs (Doxylamine succinate (sleep)) .... One tablet by  mouth three times a day for nausea.  please label in spanish 5)  Vitamin B-6 100 Mg Tabs (Pyridoxine hcl) .... One tablet by mouth three times a day for nausea.  take with unisom.  please label in spanish  Patient Instructions: 1)  Please schedule a follow-up appointment in 1 week with Dr Janalyn Harder or Gwendolyn Grant.  2)  Dr Mack Hook pager [phone].  If you are admitted to [facility] hospital please ask the nurse to call me. 3)  Kick Counts:  If the baby is not moving as much as normal, then count for 1 hr.  If less than 5 movements in one hour, then drink water, lay down for 30 minutes.  Then count for 2 hrs, should move at least 10 times.  If not go to [facility] hospital. 4)  Go to [facility] hospital for bleeding or water breaking.  5)  I want you to drink plenty of fluids.   6)  Depo $139 every 3 months. 7)  [facility] card does not pay for this.   Flowsheet View for Follow-up Visit    Estimated weeks of       gestation:     38 5/7    Weight:  145    Blood pressure:   124 / 77    Hx headache?     No    Nausea/vomiting?   No    Edema?     TrLE    Bleeding?     no    Leakage/discharge?   no    Fetal activity:       yes    Labor symptoms?   few ctx    Fundal height:      37.5    FHR:       150s    Taking Vitamins?   Y    Smoking PPD:   no    Comment:     Vomited one time today after having lunch with friend to had on a strong perfume.  No fever.  Did not sleep at all last night because her daughter was sick.    Next visit:     1 wk    Resident:     Alberto Pina    Preceptor:     Pearletha Forge

## 2010-05-01 NOTE — Progress Notes (Signed)
Summary: triage  Phone Note Call from Patient Call back at Home Phone [phone]   Caller: Patient Summary of Call: not sure if she needs appt - having irregular periods for 3-4 weeks Initial call taken by: De Nurse,  December 11, 2009 1:51 PM  Follow-up for Phone Call        she is not currently using birth control. appt thursday with pcp to address bleeding & contraception Follow-up by: Golden Circle RN,  December 11, 2009 2:05 PM

## 2010-05-01 NOTE — Assessment & Plan Note (Signed)
Summary: postpartum visit,tcb   Vital Signs:  Patient profile:   35 year old female Height:      61.75 inches Weight:      126 pounds BMI:     23.32 Temp:     97.9 degrees F oral Pulse rate:   80 / minute BP sitting:   112 / 75  (right arm) Cuff size:   regular  Vitals Entered By: Tessie Fass CMA (Aug 09, 2009 4:12 PM) CC: postpartum check Is Patient Diabetic? No Pain Assessment Patient in pain? no        Primary Care Provider:  Sully Manzi MD  CC:  postpartum check.  History of Present Illness: 35 y/o F here for 6 wk pp exam.  Delivered healthy female by [facility] on 06/29/09.    During prenatal care there were concerns regarding guilt patient felt with Elective abortion she had >10 yrs ago, but there were no red flags that lead me to think that patient required antidepressants.  Still thinking about previous SAB, but she talks to her newborn and feels better.  States that when her children are older, she may discuss it with them.  The first week post partum she stayed in the house all day.  During 2nd and 3rd week pp she started going to the grocery store and liked that very much.  Menses: not yet, but only spotting now.    Birth control: Depo at discharge 07/01/09  Breast feeding: no pain with breast feeding.  Milk still coming in ok.  She is not pumping.  Taking prenatal vitamins.  Fatigue: she feels more tired since going back to work part time last week.   ***Asked [place] Screening questions: negative     Habits & Providers  Alcohol-Tobacco-Diet     Tobacco Status: never  Current Medications (verified): 1)  P D Natal Vitamins/folic Acid  Tabs (Prenatal Multivit-Min-Fe-Fa) .... One Tablet By Mouth Daily. Please Label in Spanish  Allergies (verified): No Known Drug Allergies  Past History:  Past Medical History: Breast lump - 611.72 - now gone  G3P2012 Depo 06/30/2009  Physical Exam  General:  Well-developed,well-nourished,in no acute distress;  alert,appropriate and cooperative throughout examination. vitals reviewed.  Genitalia:  Normal introitus for age, no external lesions, no vaginal discharge, mucosa pink and moist, no vaginal or cervical lesions, no vaginal atrophy, no friaility or hemorrhage, normal uterus size and position, no adnexal masses or tenderness   Impression & Recommendations:  Problem # 1:  PREGNANCY, NORMAL (ICD-V22.2) 6 wk pp exam.  pap done today.  [place] screen negative for depression.  Although pt still has lingering guilt over EAB and becomes tearful at times, I do not believe she is at risk to herself or newborn/others.  Pt to continue pnv as she is breast feeding. Orders: Pap Sm[facility] ([id number]) [id number]  Problem # 2:  FATIGUE (ICD-780.79) Assessment: New Will check CBC.  Most likely 2/2 breast feeding and going back to work.  Orders: CBC-[facility] ([id number]) Postpartum visit- [facility] ([id number])  Complete Medication List: 1)  P D Natal Vitamins/folic Acid Tabs (Prenatal multivit-min-fe-fa) .... One tablet by mouth daily. please label in spanish   Prevention & Chronic Care Immunizations   Influenza vaccine: Fluvax Non-MCR  (01/23/2009)    Tetanus booster: 04/01/1996: Done.    Pneumococcal vaccine: Not documented  Other Screening   Pap smear: Specimen Adequacy: Satisfactory for evaluation.   Interpretation/Result:Negative for intraepithelial Lesion or Malignancy.     (05/09/2008)   Smoking  status: never  (08/09/2009)

## 2010-05-01 NOTE — Miscellaneous (Signed)
Summary: Order for BPP, NST  Clinical Lists Changes  Orders: Added new Test order of Fetal Biophysical profile w/ NST - 81191 (Fetal Bio) - Signed

## 2010-05-01 NOTE — Assessment & Plan Note (Signed)
Summary: ob visit/eo  31 6/7   Vital Signs:  Patient profile:   35 year old female Weight:      132.2 pounds Temp:     98.2 degrees F oral Pulse rate:   86 / minute Pulse rhythm:   regular BP sitting:   96 / 65  (left arm) Cuff size:   regular  Vitals Entered By: Loralee Pacas CMA (May 04, 2009 11:22 AM)  Primary Care Provider:  Cat Ta MD  CC:  FU OB.  History of Present Illness: 35 yo G3P1011 presents today with 2 main complaints:  1.  Nose bleeding:  Pt states nose bleeding starts at night and in AM for past month.  Complains of nasal congestion concurrently.  Mostly bleeding when she blows her nose.  Stops pretty quickly after it starts.  No pain.    2.  Vaginal pain:  pain mostly centered in groin and vagina.  Also endorses history of "feeling her bones move."  Pain mostly occurs at night, almost every night.  Worse when lying on her back, has tried putting a pillow between her legs with some relief.  Has not tried pain meds.    ROS:  no anorexia, n/v, no weakness, syncope.   Habits & Providers  Alcohol-Tobacco-Diet     Cigarette Packs/Day: n/a  Allergies: No Known Drug Allergies  Social History: Packs/Day:  n/a  Physical Exam  General:  Vital signs reviewed Well-developed, well-nourished patient in NAD.  Awake, cooperative.  Lungs:  clear to auscultation  Heart:  RRR without murmurs Abdomen:  gravid fundal height 31 cm Extremities:  no edema    Impression & Recommendations:  Problem # 1:  PREGNANCY, NORMAL (ICD-V22.2)  35 y/o G3P1011 at 41 6/7 wga by LMP and 19 wga U/S with [facility] 06/30/08.   U/S at 41 wga showed that EFW is 41%, with normal placenta.  Pt has not decided about birth control.  No Epidural.  No IV pain meds.  Baby will be followed by [facility] since her other daughter, Steward Drone, is seen by them.   Labs:  A+/Ab neg, [place] neg, HIV NR, Hep B neg, RPR NR, Rubella IMM, 12/06/08 H/H 12.1/35.8, 12/12/08  GC/Chlam neg.  Early 1hr glucola (01/04/09) 145, 3 hr  glucola 83, 98, 106, 114.  Repeat 28 wk 3-hr glucola 03/10/10 86 (fasting), 164, 161, 104  No problems with today's pregnancy.  Gave labor precautions.  Reassured patient that her vaginal pain is normal pregnancy pain, and that pain and sensation of bone movement she's feeling is laxity in her ligaments.  Told patient she could take Tylenol if she continues having pain. Follow-up appt 2 weeks with Dr Janalyn Harder.     Orders: Other OB visit- [facility] (OBCK)  Problem # 2:  MAJOR DEPRESSIVE DISORDER RECURRENT EPISODE MILD (ICD-296.31) Discussed this with patient without actually bringing up abortion.  Asked how patient felt, if she felt sad.  Pt became teary-eyed but stated she's doing very well and is happy about this pregnancy.  Told patient to be sure to bring this up if she's having problems in future.  Will follow up next appt.   Complete Medication List: 1)  P D Natal Vitamins/folic Acid Tabs (Prenatal multivit-min-fe-fa) .... One tablet by mouth daily. please label in spanish 2)  Ranitidine Hcl 150 Mg Caps (Ranitidine hcl) .... One tablet by mouth daily for heartburn 3)  Ondansetron Hcl 4 Mg Tabs (Ondansetron hcl) .... One tablet by mouth every 8 hours for  nausea 4)  Unisom 25 Mg Tabs (Doxylamine succinate (sleep)) .... One tablet by mouth three times a day for nausea.  please label in spanish 5)  Vitamin B-6 100 Mg Tabs (Pyridoxine hcl) .... One tablet by mouth three times a day for nausea.  take with unisom.  please label in spanish   Flowsheet View for Follow-up Visit    Estimated weeks of       gestation:     31 6/7    Weight:     132.2    Blood pressure:   96 / 65    Hx headache?     No    Nausea/vomiting?   No    Edema?     0    Bleeding?     no    Leakage/discharge?   no    Fetal activity:       yes    Labor symptoms?   no    Fundal height:      31    FHR:       140    Fetal position:      vertex    Taking Vitamins?   Y    Smoking PPD:   n/a    Comment:     F/U appt Dr. Janalyn Harder 2 weeks     Next visit:     2 wk    Resident:     Gwendolyn Grant    Preceptor:     [place]    Flowsheet View for Follow-up Visit    Estimated weeks of       gestation:     31 6/7    Weight:     132.2    Blood pressure:   96 / 65    Headache:     No    Nausea/vomiting:   No    Edema:     0    Vaginal bleeding:   no    Vaginal discharge:   no    Fundal height:      31    FHR:       140    Fetal activity:     yes    Labor symptoms:   no    Fetal position:     vertex    Taking prenatal vits?   Y    Smoking:     n/a    Next visit:     2 wk    Resident:     Gwendolyn Grant    Preceptor:     [place]    Comment:     F/U appt Dr. Janalyn Harder 2 weeks   OB Initial Intake Information    Positive HCG by: self, health dept    Race: Other    Marital status: Married    Occupation: outside work    Type of work: Chief Technology Officer, 4 days/wk    Education (last grade completed): 12    Number of children at home: 1    [facility]    Newborn's physician: [facility] on [address]  FOB Information    Husband/Father of baby: Drucilla Chalet    FOB occupation Holiday representative    Phone: [phone]    FOB Comments: FOB is not husband, but in stable relationship.  Menstrual History    LMP (date): 09/23/2008    LMP - Character: normal    Menarche: 16 years    Menses interval: 30 days    Menstrual flow 4-5 days  On BCP's at conception: no    Date of positive (+) home preg. test: 11/04/2008     Primary Care Provider:  Cat Ta MD  CC:  [facility] OB.  History of Present Illness: 35 yo G3P1011 presents today with 2 main complaints:  1.  Nose bleeding:  Pt states nose bleeding starts at night and in AM for past month.  Complains of nasal congestion concurrently.  Mostly bleeding when she blows her nose.  Stops pretty quickly after it starts.  No pain.    2.  Vaginal pain:  pain mostly centered in groin and vagina.  Also endorses history of "feeling her bones move."  Pain mostly occurs at night, almost every  night.  Worse when lying on her back, has tried putting a pillow between her legs with some relief.  Has not tried pain meds.    ROS:  no anorexia, n/v, no weakness, syncope.    Appended Document: ob visit/eo I saw and examined Ms. Derrek Gu with Dr. Gwendolyn Grant and agree with his plan and note.  Nosebleeds are likely normal condition of pregnancy.  Pt also with bleeding gums when brushes teeth, but no other bleeding.  Pelvic pain is likely ligament laxity. Denies feelings of sadness at this time.  Pt given PTL precautions and kick counts. Follow up with Dr. Janalyn Harder in 2 weeks.

## 2010-05-01 NOTE — Assessment & Plan Note (Signed)
Summary: ob visit/eo: 33 5/7   Vital Signs:  Patient profile:   35 year old female Height:      61.75 inches Weight:      137.1 pounds BMI:     25.37 Temp:     97.8 degrees F oral Pulse rate:   93 / minute BP sitting:   109 / 69  (left arm) Cuff size:   regular  Vitals Entered By: Gladstone Pih (May 17, 2009 2:47 PM) CC: OB 33   5/7 Is Patient Diabetic? No Pain Assessment Patient in pain? no        Primary Care Provider:  Cay Kath MD  CC:  OB 33   5/7.  History of Present Illness: 35 y/o G3P1011 at 36 5/7 wga by LMP and 19 wga U/S with [facility] 06/30/08.   U/S at 5 wga showed that EFW is 41%, with normal placenta.   Habits & Providers  Alcohol-Tobacco-Diet     Tobacco Status: never     Cigarette Packs/Day: no  Allergies: No Known Drug Allergies  Social History: Packs/Day:  no   Impression & Recommendations:  Problem # 1:  PREGNANCY, NORMAL (ICD-V22.2) Assessment Unchanged 35 y/o G3P1011 at 43 5/7 wga by LMP and 19 wga U/S with [facility] 06/30/08.   U/S at 42 wga showed that EFW is 41%, with normal placenta. Birth control: pt thinks either Depo or IUD.  No Epidural.  No IV pain meds.  Baby will be followed by [facility] since her other daughter, Steward Drone, is seen by them.  Wet prep done today as pt complained of yellowish vaginal discharge.  Result: wnl.    Pt to be seen in 2 wks with me.  GBS at next visit.   Labs:  A+/Ab neg, [place] neg, HIV NR, Hep B neg, RPR NR, Rubella IMM, 12/06/08 H/H 12.1/35.8, 12/12/08  GC/Chlam neg.  Early 1hr glucola (01/04/09) 145, 3 hr glucola 83, 98, 106, 114.  Repeat 28 wk 3-hr glucola 03/10/10 86 (fasting), 164, 161, 104  Orders: Other OB visit- [facility] (OBCK)  Complete Medication List: 1)  P D Natal Vitamins/folic Acid Tabs (Prenatal multivit-min-fe-fa) .... One tablet by mouth daily. please label in spanish 2)  Ranitidine Hcl 150 Mg Caps (Ranitidine hcl) .... One tablet by mouth daily for heartburn 3)  Ondansetron Hcl 4 Mg Tabs (Ondansetron hcl) .... One  tablet by mouth every 8 hours for nausea 4)  Unisom 25 Mg Tabs (Doxylamine succinate (sleep)) .... One tablet by mouth three times a day for nausea.  please label in spanish 5)  Vitamin B-6 100 Mg Tabs (Pyridoxine hcl) .... One tablet by mouth three times a day for nausea.  take with unisom.  please label in spanish  [id number]  Patient Instructions: 1)  Please schedule a follow-up appointment in 2 weeks with Dr Janalyn Harder.  2)  Dr Mack Hook pager [phone].  If you are admitted to [facility] hospital please ask the nurse to call me. 3)  Kick Counts:  If the baby is not moving as much as normal, then count for 1 hr.  If less than 5 movements in one hour, then drink water, lay down for 30 minutes.  Then count for 2 hrs, should move at least 10 times.  If not go to [facility] hospital. 4)  Go to [facility] hospital for bleeding or water breaking.  5)  I want you to drink plenty of fluids.      Flowsheet View for Follow-up  Visit    Estimated weeks of       gestation:     33 5/7    Weight:     137.1    Blood pressure:   109 / 69    Headache:     No    Nausea/vomiting:   No    Edema:     TrLE    Vaginal bleeding:   no    Vaginal discharge:   some    Fundal height:      32    FHR:       140s    Fetal activity:     yes    Labor symptoms:   no    Taking prenatal vits?   Y    Smoking:     no    Next visit:     2 wk    Resident:     Kamylah Manzo    Preceptor:     Mauricio Po    Comment:     In AM mouth feels dry for 30 sec.  Still with some bleeding gums with brushing teeth.  Will see new dentist after delivery.     Flowsheet View for Follow-up Visit    Estimated weeks of       gestation:     33 5/7    Weight:     137.1    Blood pressure:   109 / 69    Hx headache?     No    Nausea/vomiting?   No    Edema?     TrLE    Bleeding?     no    Leakage/discharge?   some    Fetal activity:       yes    Labor symptoms?   no    Fundal height:      32    FHR:       140s    Taking Vitamins?   Y     Smoking PPD:   no    Comment:     In AM mouth feels dry for 30 sec.  Still with some bleeding gums with brushing teeth.  Will see new dentist after delivery.     Next visit:     2 wk    Resident:     Antoniette Peake    Preceptor:     Mauricio Po    Laboratory Results  Date/Time Received: May 17, 2009 3:23 PM  Date/Time Reported: May 17, 2009 3:29 PM   [employer] Source: vaginal WBC/hpf: 15-20 Bacteria/hpf: 3+  Rods Clue cells/hpf: none  Negative whiff Yeast/hpf: none Trichomonas/hpf: none Comments: ...........test performed by...........Marland KitchenTerese Door, CMA

## 2010-05-01 NOTE — Assessment & Plan Note (Signed)
Summary: Viral URI   Vital Signs:  Patient profile:   35 year old female Height:      61.75 inches Weight:      122 pounds BMI:     22.58 Temp:     99.1 degrees F oral Pulse rate:   76 / minute BP sitting:   109 / 73  (left arm) Cuff size:   regular  Vitals Entered By: Jimmy Footman, CMA (November 21, 2009 3:29 PM) CC: Cough and congestion x1 week Is Patient Diabetic? No Comments Robitussin----not helping   Primary Care Provider:  Cat Ta MD  CC:  Cough and congestion x1 week.  History of Present Illness: 33 YOF here for acute visit: -Pt reports 6-7 day hx/o nasal congestion, rhinnorrhea, cough, general myalgias, . No sick contacts per pt. Went to UC, recieved azithromycin 3-4 days ago w. no improvement in sxs. No N/V/D per pt. tolerating by mouth intake well. UOP unchanged per pt.   Allergies: No Known Drug Allergies  Physical Exam  General:  alert, minimal distress Head:  NCAT, EOMI Ears:  R TM: clear w/o erythema L TM: Minimally distended, +erythema Nose:  nasal erythema bilaterally, rhinorrhea bilaterally Mouth:  good dentition.   Neck:  supple and full ROM.   Lungs:  CTAB, no wheezes, rales, rhoncii Heart:  RRR, no rubs, gallops, murmurs   Impression & Recommendations:  Problem # 1:  VIRAL URI (ICD-465.9) Likely viral URI. Discussed disease course as well as red flags for return. Reinforced encouraging by mouth intake as well as nasal saline for congestion. Pt agreeable to plan.  Orders: [facility]- Est Level  3 (16109)  Complete Medication List: 1)  P D Natal Vitamins/folic Acid Tabs (Prenatal multivit-min-fe-fa) .... One tablet by mouth daily. please label in spanish  Patient Instructions: 1)  It was good meeting you today 2)  Get plenty of rest, drink lots of clear liquids, and use Tylenol or Ibuprofen for fever and comfort. Use nasal saline for nasal congestion as needed.  3)  You may start breast feeding in a few days  4)  Return in 7-10 days if you're not  better: sooner if you'er feeling worse.  5)  God Bless,  6)  Doree Albee MD

## 2010-05-01 NOTE — Miscellaneous (Signed)
Summary: re: holisters/ts  Clinical Lists Changes faxed holisters to [facility] [phone] and sent original via inter-office mail.Arlyss Repress CMA,  June 02, 2009 5:01 PM  Appended Document: re: holisters/ts fax [phone]

## 2010-05-01 NOTE — Assessment & Plan Note (Signed)
Summary: OB: 36 5/7 wga   Vital Signs:  Patient profile:   35 year old female Weight:      143 pounds BP sitting:   105 / 68  Primary Care Provider:  Mcgwire Dasaro MD  CC:  OB.  History of Present Illness: 35 y/o G3P1011 at 33 5/7 wga with [facility] 06/30/09.   Habits & Providers  Alcohol-Tobacco-Diet     Cigarette Packs/Day: no  Allergies: No Known Drug Allergies   Impression & Recommendations:  Problem # 1:  PREGNANCY, NORMAL (ICD-V22.2) Assessment Unchanged  35 y/o G3P1011 at 62 5/7 wga by LMP and 19 wga U/S with [facility] 06/30/09.   U/S at 9 wga showed that EFW is 41%, with normal placenta. Birth control: pt thinks either Depo or IUD.  No Epidural.  No IV pain meds.  Baby will be followed by [facility] since her other daughter, Steward Drone, is seen by them. GBS done today.  Did not repeat CG/Chlam as pt had friable cervix previously and I think her risks are low.  Instructions given for going to [facility].  Reflux better on Ranitadine.    Pt to be seen in 1 wks with me or Dr Gwendolyn Grant.  Records faxed to [facility] with GBS negative.  Will check for position with U/S when pt is around 38 wga.   Labs:  A+/Ab neg, [place] neg, HIV NR, Hep B neg, RPR NR, Rubella IMM, 12/06/08 H/H 12.1/35.8, 12/12/08  GC/Chlam neg.  Early 1hr glucola (01/04/09) 145, 3 hr glucola 83, 98, 106, 114.  Repeat 28 wk 3-hr glucola 03/10/10 86 (fasting), 164, 161, 104.  GBS [place]ie 05/31/09.  Orders: Other OB visit- [facility] ([facility])  Complete Medication List: 1)  P D Natal Vitamins/folic Acid Tabs (Prenatal multivit-min-fe-fa) .... One tablet by mouth daily. please label in spanish 2)  Ranitidine Hcl 150 Mg Caps (Ranitidine hcl) .... One tablet by mouth daily for heartburn 3)  Ondansetron Hcl 4 Mg Tabs (Ondansetron hcl) .... One tablet by mouth every 8 hours for nausea 4)  Unisom 25 Mg Tabs (Doxylamine succinate (sleep)) .... One tablet by mouth three times a day for nausea.  please label in spanish 5)  Vitamin B-6 100 Mg Tabs (Pyridoxine hcl) .... One tablet by  mouth three times a day for nausea.  take with unisom.  please label in spanish  Patient Instructions: 1)  Please schedule a follow-up appointment in 1 week with Dr Janalyn Harder or Dr Gwendolyn Grant.  2)  Dr Mack Hook pager [phone].  If you are admitted to [facility] hospital please ask the nurse to call me. 3)  Kick Counts:  If the baby is not moving as much as normal, then count for 1 hr.  If less than 5 movements in one hour, then drink water, lay down for 30 minutes.  Then count for 2 hrs, should move at least 10 times.  If not go to [facility] hospital. 4)  Go to [facility] hospital for bleeding or water breaking.  5)  I want you to drink plenty of fluids.     Flowsheet View for Follow-up Visit    Estimated weeks of       gestation:     36 5/7    Weight:     143    Blood pressure:   105 / 68    Hx headache?     No    Nausea/vomiting?   No    Edema?     0    Bleeding?  no    Leakage/discharge?   no    Fetal activity:       yes    Labor symptoms?   no    Fundal height:      35    FHR:       140s    Taking Vitamins?   Y    Smoking PPD:   no    Comment:     cut finger trying to get splinter out, went to UC on 06/05/09 and received tetanus vax.    Next visit:     1 wk    Resident:     Yakir Wenke    Preceptor:     Lelon Frohlich

## 2010-05-01 NOTE — Progress Notes (Signed)
  Phone Note Call from Patient   Caller: Patient Call For: [phone] Summary of Call: Still having uterine bleeding from cycle last month.  Need to have rx that was to been called in day of visit to be sent to [facility] on [address]. Initial call taken by: Abundio Miu,  December 26, 2009 11:33 AM    Prescriptions: PREMARIN 1.25 MG TABS (ESTROGENS CONJUGATED) 1 tab by mouth daily x 7 days  #7 x 1   Entered and Authorized by:   Angeline Slim MD   Signed by:   Angeline Slim MD on 12/26/2009   Method used:   Electronically to        [facility] [phone]* (retail)       [address]       [place], [name]  [address]       Ph: [phone]       Fax: [phone]   RxID:   [id number]

## 2010-06-17 LAB — GLUCOSE, CAPILLARY: Glucose-Capillary: 88 mg/dL (ref 70–99)

## 2010-06-22 LAB — CBC
HCT: 37.2 % (ref 36.0–46.0)
Hemoglobin: 12.9 g/dL (ref 12.0–15.0)
MCV: 92.8 fL (ref 78.0–100.0)
RBC: 4.01 MIL/uL (ref 3.87–5.11)
WBC: 11.1 10*3/uL — ABNORMAL HIGH (ref 4.0–10.5)

## 2010-07-06 LAB — GLUCOSE, CAPILLARY: Glucose-Capillary: 145 mg/dL — ABNORMAL HIGH (ref 70–99)

## 2010-08-03 ENCOUNTER — Ambulatory Visit (INDEPENDENT_AMBULATORY_CARE_PROVIDER_SITE_OTHER): Payer: Self-pay | Admitting: Family Medicine

## 2010-08-03 ENCOUNTER — Encounter: Payer: Self-pay | Admitting: Family Medicine

## 2010-08-03 VITALS — BP 103/67 | HR 84 | Temp 98.4°F | Ht 61.5 in | Wt 120.5 lb

## 2010-08-03 DIAGNOSIS — R05 Cough: Secondary | ICD-10-CM

## 2010-08-03 MED ORDER — DEXTROMETHORPHAN HBR 15 MG/5ML PO SYRP: 10.0000 mL | ORAL_SOLUTION | Freq: Four times a day (QID) | ORAL | Status: DC | PRN

## 2010-08-03 MED ORDER — LORATADINE 10 MG PO TABS: 10.0000 mg | ORAL_TABLET | Freq: Every day | ORAL | Status: DC

## 2010-08-03 MED ORDER — DEXTROMETHORPHAN HBR 15 MG/5ML PO SYRP: 10.0000 mL | ORAL_SOLUTION | Freq: Four times a day (QID) | ORAL | Status: AC | PRN

## 2010-08-03 MED ORDER — FLUTICASONE PROPIONATE 50 MCG/ACT NA SUSP: 1.0000 | Freq: Every day | NASAL | Status: DC

## 2010-08-03 NOTE — Patient Instructions (Signed)
Rinitis alérgica   (Allergic Rhinitis)   La rinitis alérgica aparece cuando las membranas mucosas de la nariz reaccionan a los alérgenos. Los alérgenos son las partículas que están en el aire y a las que el organismo responde cuando existe una reacción alérgica. Esto hace que usted libere anticuerpos de alergia. A través de una sucesión de procesos, finalmente se libera histamina (de ahí el uso de antihistamínicos) en el torrente sanguíneo. Aunque esto implica una protección para su organismo, es lo que le produce las molestias., como estornudos frecuentes, congestión, picazón y goteos de la nariz.   CAUSAS   Los alergenos del polen pueden provenir del césped, árboles y hierbas. Esto produce la rinitis alérgica estacional, o “fiebre de heno”. Otras alérgenos pueden ocasionar rinitis alérgica persistente (rinitis alérgica perenne) como aquellos que contienen los ácaros del polvo del hogar, el pelaje de las mascotas y las esporas del moho.   SÍNTOMAS   Ø Congestión nasal.   Ø Picazón y goteo de la nariz con estornudos y lagrimeo de los ojos.   Ø Generalmente, también puede haber picazón de la boca, ojos y oídos.   Las alergias no pueden curarse pero pueden controlarse con medicamentos.   DIAGNÓSTICO   Si no reconoce exactamente cuál es el alérgeno que le ocasiona el problema, podrán realizarle pruebas de sangre, o de piel para determinarlo.   TRATAMIENTO   Ø Evite el alérgeno.   Ø Podrán ser útiles medicamentos y vacunas para la alergia (inmunoterapia).   Ø Con frecuencia la fiebre de heno se trata simplemente con antihistamínicos en forma de píldoras o sprays nasales. Los antihistamínicos bloquean los efectos de la histamina. Existen medicamentos de venta libre que lo ayudarán a aliviar la picazón, la congestión nasal y la hinchazón alrededor de los ojos. Consulte con el profesional antes de tomar o administrar estos medicamentos.   Si estos medicamentos no le resultan efectivos, existen muchos otros nuevos que el  profesional que lo asiste puede prescribirle. Si las medidas iniciales no son efectivas, podrán utilizarse medicamentos más fuertes. Las inyecciones desensibilizantes pueden utilizarse si los otros medicamentos fracasan. La desensibilización aparece cuando un paciente recibe inyecciones continuas hasta que el cuerpo se vuelve menos sensible al alérgeno. Asegúrese de realizar un seguimiento con el profesional que lo asiste si los problemas continúan.   SOLICITE ANTENCIÓN MÉDICA SI:   Ø Le sube la temperatura a más de 100.5º F (38.1º C).   Ø Presenta tos que no se alivia (persistente).   Ø Le falta el aire.   Ø Comienza a respirar con dificultad.   Ø Los síntomas interfieren con las actividades diarias.   Document Released: 12/26/2004 Document Re-Released: 01/13/2009   ExitCare® Patient Information ©2011 [employer].

## 2010-08-03 NOTE — Assessment & Plan Note (Addendum)
Allergic vs viral vs post nasal drip.  Will try flonase and claritin daily.  Will Rx dextromethorphan (pt still breastfeeding) for cough.  Pt to rtc in 1 wk if not improved and will consider antibiotic at that time.

## 2010-08-03 NOTE — Progress Notes (Signed)
  Subjective:    Patient ID: Audrey Jensen, female    DOB: [birth year], 35 y.o.   MRN: [id number]  HPI  Cough: Patient complains of chills, eye irritation, fever, headache (frontal), rhinorrhea , sneezing and sore throat.  Symptoms began 3 days ago.  The cough is without wheezing, dyspnea or hemoptysis, productive of green/yellow sputum and is aggravated by cold air Associated symptoms include:change in voice, chills, fever, postnasal drip, shortness of breath and sputum production. Patient does not have new pets. Patient does not have a history of asthma. Patient does not have a history of environmental allergens. Patient does not have recent travel. Patient does not have a history of smoking. Patient  does not have previous Chest X-ray. Patient does not have had a PPD done. Tmax at home 101 and 100.   She has been taking Robutussin, which helps cough only a little bit.  She is also having some back pain from coughing.  She is taking Tylenol and Ibuprofen    Review of Systems Per hpi     Objective:   Physical Exam  Constitutional: She appears well-developed and well-nourished. No distress.  HENT:  Head: Normocephalic and atraumatic.  Right Ear: External ear normal.  Left Ear: External ear normal.  Nose: Mucosal edema and rhinorrhea present. Right sinus exhibits no maxillary sinus tenderness and no frontal sinus tenderness. Left sinus exhibits no maxillary sinus tenderness and no frontal sinus tenderness.  Mouth/Throat: Oropharynx is clear and moist.  Eyes: Conjunctivae are normal. Pupils are equal, round, and reactive to light.  Neck: Normal range of motion. Neck supple.  Cardiovascular: Normal rate, regular rhythm, normal heart sounds and intact distal pulses.   No murmur heard. Pulmonary/Chest: Effort normal and breath sounds normal. No respiratory distress. She has no wheezes.  Lymphadenopathy:    She has no cervical adenopathy.          Assessment & Plan:

## 2010-08-20 ENCOUNTER — Encounter: Payer: Self-pay | Admitting: Family Medicine

## 2010-08-23 ENCOUNTER — Encounter: Payer: Self-pay | Admitting: Family Medicine

## 2010-08-29 ENCOUNTER — Encounter: Payer: Self-pay | Admitting: Family Medicine

## 2010-09-12 ENCOUNTER — Encounter: Payer: Self-pay | Admitting: Family Medicine

## 2010-09-12 ENCOUNTER — Ambulatory Visit (INDEPENDENT_AMBULATORY_CARE_PROVIDER_SITE_OTHER): Payer: Self-pay | Admitting: Family Medicine

## 2010-09-12 ENCOUNTER — Other Ambulatory Visit (HOSPITAL_COMMUNITY)
Admission: RE | Admit: 2010-09-12 | Discharge: 2010-09-12 | Disposition: A | Discharge status: Home / Self Care | Payer: Self-pay | Source: Ambulatory Visit | Attending: Family Medicine | Admitting: Family Medicine

## 2010-09-12 VITALS — BP 108/67 | HR 73 | Temp 98.5°F | Ht 62.3 in | Wt 116.4 lb

## 2010-09-12 DIAGNOSIS — R5381 Other malaise: Secondary | ICD-10-CM

## 2010-09-12 DIAGNOSIS — Z01419 Encounter for gynecological examination (general) (routine) without abnormal findings: Secondary | ICD-10-CM | POA: Insufficient documentation

## 2010-09-12 DIAGNOSIS — N760 Acute vaginitis: Secondary | ICD-10-CM

## 2010-09-12 DIAGNOSIS — N898 Other specified noninflammatory disorders of vagina: Secondary | ICD-10-CM

## 2010-09-12 DIAGNOSIS — Z124 Encounter for screening for malignant neoplasm of cervix: Secondary | ICD-10-CM

## 2010-09-12 DIAGNOSIS — R5383 Other fatigue: Secondary | ICD-10-CM

## 2010-09-12 LAB — POCT WET PREP (WET MOUNT): Trichomonas Wet Prep HPF POC: NEGATIVE

## 2010-09-12 LAB — CBC
HCT: 34.4 % — ABNORMAL LOW (ref 36.0–46.0)
MCV: 84.1 fL (ref 78.0–100.0)
RBC: 4.09 MIL/uL (ref 3.87–5.11)
WBC: 9.7 10*3/uL (ref 4.0–10.5)

## 2010-09-12 LAB — T4, FREE: Free T4: 1.04 ng/dL (ref 0.80–1.80)

## 2010-09-12 MED ORDER — PRENATAL RX 60-1 MG PO TABS: 1.0000 | ORAL_TABLET | Freq: Every day | ORAL | Status: AC

## 2010-09-12 NOTE — Assessment & Plan Note (Signed)
Pap smear culture taken today.

## 2010-09-12 NOTE — Assessment & Plan Note (Signed)
Will check GC/Chlam.

## 2010-09-12 NOTE — Assessment & Plan Note (Signed)
Wet prep negative for clue cells, yeast, trich.

## 2010-09-12 NOTE — Progress Notes (Signed)
Subjective:    Patient ID: Audrey Jensen, female    DOB: 08-Oct-1975, 35 y.o.   MRN: [id number]  HPI Pt is here for annual exam. She complains of  Fatigue: She is not sure if it is due to stress, work schedule and taking care of all finances.  She is still breast feeding but ran out of prenatal vitamins.    Stress: Her partner was admitted for chest pain that was ruled out and thought to be from anxiety.  Since then he has not returned to work.  He has lost his job. She is having to pay all the bills.  Sometimes her partner would not watch their infant daughter even though he is not working right now.  When that happens she has to pay for a baby sitter so that she can go to work.   Still breast feeding.  GYN: LMP 08/29/10.  She is using condoms for contraception.  We discusses different options for contraception (depo, mirena, implanon).  She also has vaginal discharge.  No itching.  No pelvic pain.    Past Medical History:  Breast lump - 611.72 - now gone  G3P2012 Depo 06/30/2009  Past Surgical History: Last updated: 10/27/2006 IUD Mirena - 08/31/2002, partial removal ingrown toe nail - 03/10/2003, TAB - 05/02/2002  Family History:  Father - 13 yo, prostate disease (not CA) otherwise well,  Mother - deceased 39 yo, TB, hepatitis Brothers- DM, brother  Social History:  lives with the father of her daughter([name]) born 4/05.; Native of [place]; Boyfriend is a Corporate investment banker. Works at [employer] at [employer]; No etoh, no tob.no drugs.   Review of Systems  Constitutional: Negative for fever, activity change, appetite change, fatigue and unexpected weight change.  Respiratory: Negative for cough, choking and wheezing.   Cardiovascular: Negative for chest pain and palpitations.  Gastrointestinal: Negative for nausea, vomiting, abdominal pain and abdominal distention.  Genitourinary: Positive for vaginal discharge. Negative for dysuria, frequency, flank pain,  vaginal bleeding, vaginal pain and pelvic pain.  Neurological: Negative for seizures, weakness, numbness and headaches.  Hematological: Negative.   Psychiatric/Behavioral: Negative.        Objective:   Physical Exam  Constitutional: She is oriented to person, place, and time. She appears well-developed and well-nourished. No distress.  HENT:  Head: Normocephalic and atraumatic.  Neck: Normal range of motion. Neck supple. No thyromegaly present.  Cardiovascular: Normal rate, regular rhythm, normal heart sounds and intact distal pulses.   No murmur heard. Pulmonary/Chest: Effort normal and breath sounds normal. She has no wheezes.  Abdominal: Soft. Bowel sounds are normal. She exhibits no distension. There is no tenderness.  Genitourinary: There is no rash, tenderness or lesion on the right labia. There is no rash, tenderness or lesion on the left labia. Cervix exhibits no motion tenderness and no discharge. Right adnexum displays no mass and no tenderness. Left adnexum displays no mass and no tenderness. No erythema, tenderness or bleeding around the vagina. No foreign body around the vagina. No vaginal discharge found.  Musculoskeletal: Normal range of motion. She exhibits no edema.  Lymphadenopathy:    She has no cervical adenopathy.       Right: No inguinal adenopathy present.       Left: No inguinal adenopathy present.  Neurological: She is alert and oriented to person, place, and time.  Skin: No rash noted. No erythema.  Psychiatric: She has a normal mood and affect.  Assessment & Plan:

## 2010-09-12 NOTE — Assessment & Plan Note (Signed)
Pt complains of fatigue. She is sleeping well but is not sure if this is due to her working more than normal or if she is anemic.  She may be under a lot of stress (working more hours because her partner lost his job so that she can pay their bills, she is also taking care of their daughters because he does not want to take care of the baby).  Will check CBC, TSH.  She is still breast feeding so I will refill pnv.

## 2010-09-14 ENCOUNTER — Other Ambulatory Visit: Payer: Self-pay | Admitting: Family Medicine

## 2010-09-14 LAB — GC/CHLAMYDIA PROBE AMP, GENITAL
Chlamydia, DNA Probe: NEGATIVE
GC Probe Amp, Genital: NEGATIVE

## 2010-09-14 MED ORDER — FERROUS SULFATE 325 (65 FE) MG PO TABS: 325.0000 mg | ORAL_TABLET | Freq: Every day | ORAL | Status: DC

## 2011-03-06 ENCOUNTER — Encounter: Payer: Self-pay | Admitting: Family Medicine

## 2011-03-06 ENCOUNTER — Ambulatory Visit (INDEPENDENT_AMBULATORY_CARE_PROVIDER_SITE_OTHER): Payer: Self-pay | Admitting: Family Medicine

## 2011-03-06 VITALS — BP 111/77 | HR 76 | Ht 62.5 in | Wt 119.0 lb

## 2011-03-06 DIAGNOSIS — N898 Other specified noninflammatory disorders of vagina: Secondary | ICD-10-CM

## 2011-03-06 DIAGNOSIS — N760 Acute vaginitis: Secondary | ICD-10-CM

## 2011-03-06 DIAGNOSIS — F330 Major depressive disorder, recurrent, mild: Secondary | ICD-10-CM

## 2011-03-06 LAB — POCT WET PREP (WET MOUNT): Clue Cells Wet Prep HPF POC: NEGATIVE

## 2011-03-06 MED ORDER — CLOTRIMAZOLE 1 % VA CREA: 1.0000 | TOPICAL_CREAM | Freq: Two times a day (BID) | VAGINAL | Status: AC

## 2011-03-06 MED ORDER — FLUCONAZOLE 150 MG PO TABS: 150.0000 mg | ORAL_TABLET | Freq: Once | ORAL | Status: AC

## 2011-03-06 NOTE — Assessment & Plan Note (Signed)
We'll treat with Diflucan x1 as well as external antifungal cream. Also,yeast rash at the top of gluteal cleft.  Will treat with topical antifungal cream.

## 2011-03-06 NOTE — Assessment & Plan Note (Addendum)
Patient with a history of depression. Now here discussing stress, and some symptoms of depression.  Doesn't want to take antidepressants at this time.   Therapist appointment for her husband and she will be part of this family therapy appointment as well. Patient to return in one month since she is high risk for depression and PHQ 9 score of 11 at 12/5 appointment.

## 2011-03-06 NOTE — Progress Notes (Signed)
  Subjective:    Patient ID: Audrey Jensen, female    DOB: Apr 14, 1975, 35 y.o.   MRN: [id number]  HPI Vaginal itching: Patient reports itching and burning outside of vagina. Small amount of white/yellow discharge. No foul odor. Patient states she and her husband are monogamousx14 years. Feels she is not at risk of sexually transmitted infections.no abdominal pain. No fever. No rash. No dysuria. The urinary retention.  Life stressors: Husband recently diagnosed with depression. Per patient this is severe. Being seen by his physician. Therapist coming out next week to evaluate patient's husband. Patient tearful and she discusses this issue. She states that she is not depressed but is affected and saddened by his diagnosis of depression. PHQ-9 score of 11. Patient has anhedonia possibly have a days the past 2 weeks, has felt depressed a few days in the past 2 weeks, has had problems with sleep past of a days, a few days during the past month has had problems with energy, eating in excess, difficulty with concentration. Patient denies SI or HI. No hallucinations.  Review of Systems As per above    Objective:   Physical Exam  Constitutional: She appears well-developed and well-nourished.  Cardiovascular: Normal rate.   Pulmonary/Chest: Effort normal. No respiratory distress.  Genitourinary: Uterus normal.    There is tenderness on the right labia. There is no lesion or injury on the right labia. There is tenderness on the left labia. There is no lesion or injury on the left labia. Cervix exhibits no motion tenderness and no friability. Vaginal discharge (scant vaginal discharge--white in color.) found.  Psychiatric: She has a normal mood and affect. Her behavior is normal.       See PHQ-9 results in Hand P  Tearful.  red skin present at top of gluteal cleft.  Tender to palpation.         Assessment & Plan:

## 2011-04-03 ENCOUNTER — Ambulatory Visit: Payer: Self-pay | Admitting: Family Medicine

## 2011-04-18 ENCOUNTER — Ambulatory Visit (INDEPENDENT_AMBULATORY_CARE_PROVIDER_SITE_OTHER): Payer: Self-pay | Admitting: Family Medicine

## 2011-04-18 VITALS — BP 114/76 | HR 74 | Ht 62.5 in | Wt 119.0 lb

## 2011-04-18 DIAGNOSIS — Z23 Encounter for immunization: Secondary | ICD-10-CM

## 2011-04-18 DIAGNOSIS — F330 Major depressive disorder, recurrent, mild: Secondary | ICD-10-CM

## 2011-04-18 DIAGNOSIS — L290 Pruritus ani: Secondary | ICD-10-CM

## 2011-04-18 MED ORDER — FLUCONAZOLE 150 MG PO TABS: 150.0000 mg | ORAL_TABLET | Freq: Once | ORAL | Status: AC

## 2011-04-18 NOTE — Patient Instructions (Signed)
Return if anal discomfort not improved in 2 weeks.

## 2011-04-20 DIAGNOSIS — L290 Pruritus ani: Secondary | ICD-10-CM | POA: Insufficient documentation

## 2011-04-20 NOTE — Assessment & Plan Note (Signed)
Continue with therapy weekly.  Pt to return prn.

## 2011-04-20 NOTE — Assessment & Plan Note (Signed)
May be due to a yeast infection.  Will treat with 1 dose of diflucan to see if syptoms resolve.  If no improvement pt to return for recheck.

## 2011-04-20 NOTE — Progress Notes (Signed)
  Subjective:    Patient ID: Audrey Jensen, female    DOB: [birth year], 36 y.o.   MRN: [id number]  HPI Depression F/up: Pt states that she is doing well.  That she feels that the therapy the she and her husband and attending is working out well.  Therapist is coming to home through a special outreach project- her name is Forensic scientist.  She doesn't feel she needs any type of medication at this time.  Pt has weekly appointments with her therapist.  Currently working on marriage relationship in therapy.  No si or hi.  Anal itching/burning: Pt states that the vaginal itching and burning has resolved that she had approx 1 month back.  But recently for the past couple of weeks has had some anal burning and itching.  No drainage, no discharge.  No h/o hemmorrhoids. No anal sex. No fever.  Denies any sex toys.  Used some vaginal cream in area and this didn't seem to help.    Review of Systems As per above.     Objective:   Physical Exam  Constitutional: She appears well-developed and well-nourished.  Cardiovascular: Normal rate.   Pulmonary/Chest: Effort normal. No respiratory distress.  Abdominal: Soft. She exhibits no distension.  Genitourinary: Rectal exam shows no external hemorrhoid, no fissure and anal tone normal.       + erthyema around anus. No satellite lesions.  Skin: No rash noted.  Psychiatric: She has a normal mood and affect. Her behavior is normal. Judgment and thought content normal.          Assessment & Plan:

## 2011-07-31 ENCOUNTER — Ambulatory Visit (INDEPENDENT_AMBULATORY_CARE_PROVIDER_SITE_OTHER): Payer: Self-pay | Admitting: Family Medicine

## 2011-07-31 VITALS — BP 102/64 | Temp 98.2°F | Ht 62.5 in | Wt 118.0 lb

## 2011-07-31 DIAGNOSIS — H1010 Acute atopic conjunctivitis, unspecified eye: Secondary | ICD-10-CM

## 2011-07-31 MED ORDER — OLOPATADINE HCL 0.1 % OP SOLN: 1.0000 [drp] | Freq: Two times a day (BID) | OPHTHALMIC | Status: AC

## 2011-07-31 MED ORDER — FAMOTIDINE 20 MG PO TABS: 20.0000 mg | ORAL_TABLET | Freq: Two times a day (BID) | ORAL | Status: DC

## 2011-07-31 NOTE — Assessment & Plan Note (Signed)
Will treat with Patanol and Pepcid in addition to daily Claritin. OTC Visine was also advised. Ophthalmologic red flags were discussed. Instructed patient to followup if symptoms not improved in the next 1-2 weeks. Patient agreeable plan.

## 2011-07-31 NOTE — Progress Notes (Signed)
  Subjective:    Patient ID: Audrey Jensen, female    DOB: 1975/08/09, 36 y.o.   MRN: [id number]  HPI History of present illness is x34 days. Patient woke up on Saturday morning with significant right eye itching. Itching has progressed to involve left eyes since yesterday. Concomitant symptoms include rhinorrhea itchy throat as well as itchy ears. Patient denies a prior history of allergies in the past. Patient has been using Claritin with minimal to mild improvement in symptoms. Patient denies any loss of vision. Patient has about some secondary mom alive pain with eye scratching. No purulent drainage. No known sick contacts with similar symptoms.   Review of Systems See HPI, otherwise ROS negative     Objective:   Physical Exam  Constitutional: She appears well-developed and well-nourished.  HENT:  Head: Normocephalic and atraumatic.       +nasal erythema, rhinorrhea bilaterally, + post oropharyngeal erythema    Eyes: Pupils are equal, round, and reactive to light.         PERRL. Bilateral conjunctivitis with perilimbic sparring. No photophobia.   Neck: Normal range of motion.  Cardiovascular: Normal rate and regular rhythm.   Pulmonary/Chest: Effort normal and breath sounds normal.          Assessment & Plan:

## 2011-07-31 NOTE — Patient Instructions (Signed)
Allergic Conjunctivitis  The conjunctiva is a thin membrane that covers the visible white part of the eyeball and the underside of the eyelids. This membrane protects and lubricates the eye. The membrane has small blood vessels running through it that can normally be seen. When the conjunctiva becomes inflamed, the condition is called conjunctivitis. In response to the inflammation, the conjunctival blood vessels become swollen. The swelling results in redness in the normally white part of the eye.  The blood vessels of this membrane also react when a person has allergies and is then called allergic conjunctivitis. This condition usually lasts for as long as the allergy persists. Allergic conjunctivitis cannot be passed to another person (non-contagious). The likelihood of bacterial infection is great and the cause is not likely due to allergies if the inflamed eye has:  · A sticky discharge.  · Discharge or sticking together of the lids in the morning.  · Scaling or flaking of the eyelids where the eyelashes come out.  · Red swollen eyelids.  CAUSES   · Viruses.  · Irritants such as foreign bodies.  · Chemicals.  · General allergic reactions.  · Inflammation or serious diseases in the inside or the outside of the eye or the orbit (the boney cavity in which the eye sits) can cause a "red eye."  SYMPTOMS   · Eye redness.  · Tearing.  · Itchy eyes.  · Burning feeling in the eyes.  · Clear drainage from the eye.  · Allergic reaction due to pollens or ragweed sensitivity. Seasonal allergic conjunctivitis is frequent in the spring when pollens are in the air and in the fall.  DIAGNOSIS   This condition, in its many forms, is usually diagnosed based on the history and an ophthalmological exam. It usually involves both eyes. If your eyes react at the same time every year, allergies may be the cause. While most "red eyes" are due to allergy or an infection, the role of an eye (ophthalmological) exam is important. The exam  can rule out serious diseases of the eye or orbit.  TREATMENT   · Non-antibiotic eye drops, ointments, or medications by mouth may be prescribed if the ophthalmologist is sure the conjunctivitis is due to allergies alone.  · Over-the-counter drops and ointments for allergic symptoms should be used only after other causes of conjunctivitis have been ruled out, or as your caregiver suggests.  Medications by mouth are often prescribed if other allergy-related symptoms are present. If the ophthalmologist is sure that the conjunctivitis is due to allergies alone, treatment is normally limited to drops or ointments to reduce itching and burning.  HOME CARE INSTRUCTIONS   · Wash hands before and after applying drops or ointments, or touching the inflamed eye(s) or eyelids.  · Do not let the eye dropper tip or ointment tube touch the eyelid when putting medicine in your eye.  · Stop using your soft contact lenses and throw them away. Use a new pair of lenses when recovery is complete. You should run through sterilizing cycles at least three times before use after complete recovery if the old soft contact lenses are to be used. Hard contact lenses should be stopped. They need to be thoroughly sterilized before use after recovery.  · Itching and burning eyes due to allergies is often relieved by using a cool cloth applied to closed eye(s).  SEEK MEDICAL CARE IF:   · Your problems do not go away after two or three days of treatment.  ·   Your lids are sticky (especially in the morning when you wake up) or stick together.  · Discharge develops. Antibiotics may be needed either as drops, ointment, or by mouth.  · You have extreme light sensitivity.  · An oral temperature above 102° F (38.9° C) develops.  · Pain in or around the eye or any other visual symptom develops.  MAKE SURE YOU:   · Understand these instructions.  · Will watch your condition.  · Will get help right away if you are not doing well or get worse.  Document  Released: 06/08/2002 Document Revised: 03/07/2011 Document Reviewed: 05/04/2007  Exit[employer]® Patient Information ©2012 [employer].  Conjuntivitis alérgica  (Allergic Conjunctivitis)  La conjuntiva es una delgada membrana mucosa (secreción) que cubre la parte visible del globo ocular y la parte interior de los párpados. Esta membrana protege y lubrica el ojo. La membrana tiene pequeños vasos sanguíneos que pueden verse normalmente. Cuando la conjuntiva se inflama, produce un trastorno denominado conjuntivitis. En respuesta a la inflamación, los vasos sanguíneos de la conjuntiva se hinchan. La hinchazón da como resultado enrojecimiento de la zona del ojo que normalmente es blanca.  Cuando una persona es alérgica esta membrana reacciona, y por lo tanto a este trastorno se lo denomina conjuntivitis alérgica. El problema generalmente dura mientras persiste la alergia. La conjuntivitis alérgica no se transmite a otras personas (no es contagiosa). La probabilidad de infección bacteriana es grande y probablemente no se deba a una alergia si en el ojo inflamado se observa:  · Una secreción pegajosa.  · Secreción o pegoteo de las pestañas por la mañana.  · Escamas en los párpados, en la zona en que se implantan las pestañas.  · Hinchazón y enrojecimiento de los párpados  CAUSAS  · Virus.  · Irritantes, como cuerpos extraños.  · Sustancias químicas.  · Reacciones alérgicas.  · Inflamación o enfermedades graves de la zona interior o exterior del ojo o de la órbita (la cavidad ósea en la que el ojo se inserta) pueden causar un "ojo rojo".  SÍNTOMAS  · Enrojecimiento ocular.  · Lagrimeo.  · Picazón.  · Sensación de ardor.  · Secreción acuosa.  · Reacción alérgica debido al polen o sensibilidad a la ambrosía. La conjuntivitis alérgica estacional es frecuente en primavera, cuando el polen se encuentra en el aire y también en otoño.  DIAGNÓSTICO  Este trastorno, en sus variadas formas, se diagnostica según la historia clínica y el  examen oftalmológico. Generalmente implica ambos ojos Si sus ojos reaccionan al mismo tiempo todos los años, la causa podría ser una alergia. La mayoría de los casos de enrojecimiento ocular se deben a una reacción alérgica o una infección, pero es muy importante el diagnóstico oftalmológico. El examen puede descartar enfermedades graves del ojo o de la órbita.  TRATAMIENTO  · Podrán prescribirle gotas oftálmicas sin antibiótico, ungüentos o medicamentos por vía oral, si el oftalmólogo está seguro que la conjuntivitis sólo se debe a una alergia.  · Las gotas y ungüentos de venta libre para los síntomas alérgicos deben usarse sólo después que se hayan descartado otras causas de conjuntivitis, o según las indicaciones del médico.  Los medicamentos por boca generalmente se utilizan si también existen otros problemas alérgicos. Si el oftalmólogo está seguro que el medicamento se debe sólo a una alergia, el tratamiento se limita a gotas o ungüentos para reducir la picazón o el ardor.  INSTRUCCIONES PARA EL CUIDADO DOMICILIARIO  · Lávese las manos antes y después de aplicar   gotas o ungüentos, o de tocarse el ojo inflamado o los párpados.  · No deje que la punta del gotero o del tubo del ungüento toque el párpado al colocar el medicamento en el ojo.  · Suspenda el uso de las lentes de contacto blandas y descártelas. Use un nuevo par de lentes cuando se haya recuperado completamente. Si va a utilizar nuevamente las mismas lentes de contacto, complete los ciclos de esterilización al menos tres veces. Debe suspender el uso de las lentes de contacto duras. Deben ser cuidadosamente esterilizadas antes del uso, luego de la recuperación.  · La picazón y el ardor debido a alergias se alivia colocando un paño frío sobre el ojo cerrado.  SOLICITE ATENCIÓN MÉDICA SI:   · Los problemas no desaparecen luego de dos o tres días de tratamiento.  · Sus párpados están pegajosos (especialmente en la mañana al despertarse), o  pegados.  · Tiene secreciones. Podrá ser necesaria la administración de antibióticos en forma de gotas, ungüentos o por vía oral.  · Tiene extrema sensibilidad a la luz.  · Presenta una temperatura oral superior a 102° F (38.9° C).  · Siente dolor alrededor del ojo o desarrolla algún otro síntoma visual.  ESTÉ SEGURO QUE:   · Comprende las instrucciones para el alta médica.  · Controlará su enfermedad.  · Solicitará atención médica de inmediato según las indicaciones.  Document Released: 03/18/2005 Document Revised: 03/07/2011  Exit[employer]® Patient Information ©2012 [employer].

## 2011-09-26 ENCOUNTER — Encounter: Payer: Self-pay | Admitting: Family Medicine

## 2011-09-26 ENCOUNTER — Ambulatory Visit (INDEPENDENT_AMBULATORY_CARE_PROVIDER_SITE_OTHER): Payer: Self-pay | Admitting: Family Medicine

## 2011-09-26 VITALS — BP 105/64 | HR 73 | Ht 62.5 in | Wt 118.0 lb

## 2011-09-26 DIAGNOSIS — R5383 Other fatigue: Secondary | ICD-10-CM

## 2011-09-26 DIAGNOSIS — B351 Tinea unguium: Secondary | ICD-10-CM

## 2011-09-26 DIAGNOSIS — B49 Unspecified mycosis: Secondary | ICD-10-CM

## 2011-09-26 DIAGNOSIS — F330 Major depressive disorder, recurrent, mild: Secondary | ICD-10-CM

## 2011-09-26 DIAGNOSIS — M79609 Pain in unspecified limb: Secondary | ICD-10-CM

## 2011-09-26 DIAGNOSIS — M79603 Pain in arm, unspecified: Secondary | ICD-10-CM

## 2011-09-26 DIAGNOSIS — Z0000 Encounter for general adult medical examination without abnormal findings: Secondary | ICD-10-CM

## 2011-09-26 DIAGNOSIS — R5381 Other malaise: Secondary | ICD-10-CM

## 2011-09-26 LAB — COMPREHENSIVE METABOLIC PANEL
AST: 17 U/L (ref 0–37)
Albumin: 4.3 g/dL (ref 3.5–5.2)
Alkaline Phosphatase: 45 U/L (ref 39–117)
BUN: 15 mg/dL (ref 6–23)
Glucose, Bld: 103 mg/dL — ABNORMAL HIGH (ref 70–99)
Potassium: 3.9 mEq/L (ref 3.5–5.3)
Sodium: 136 mEq/L (ref 135–145)
Total Bilirubin: 0.3 mg/dL (ref 0.3–1.2)

## 2011-09-26 MED ORDER — TERBINAFINE HCL 1 % EX CREA: TOPICAL_CREAM | Freq: Two times a day (BID) | CUTANEOUS | Status: AC

## 2011-09-26 NOTE — Patient Instructions (Addendum)
Return in 6 weeks for recheck

## 2011-09-26 NOTE — Progress Notes (Signed)
  Subjective:    Patient ID: Audrey Jensen, female    DOB: [birth year], 36 y.o.   MRN: [id number]  HPI Patient here for annual physical:  Health maintenance: Patient has had 3 negative Pap smears-last Pap smear negative June 2012. Blood pressure-105/64 Patient takes multivitamin daily Uses condoms for birth control Has one sexual partner Depression screen-negative  Arm and hand pain: Patient states that she works in a [employer]-states that environment is very warm. When she gets home and injured her house that is cooler-begins to have arm and hand pain. Almost like the bones ache. This goes away after putting on a sweater. Seems to be related to the temperature change. Does not have the symptoms at all when she is at work. No fever. No joint redness. No rash. Good strength. No numbness or tingling.  Toes itching: Patient reports itching in between toes and peeling skin x2 weeks. Has tried over-the-counter antifungal cream for one week without any symptom relief. No drainage. No bleeding.  Thickened toenails: Has had dark thickened toenails that break in her brittle since she was a teenager. Would like to have something to make her toenails look better. No pain from toenails. No redness around her nails. No drainage from nails.  Smoking status reviewed.     Review of Systems As per above    Objective:   Physical Exam  Constitutional: She appears well-developed and well-nourished.  HENT:  Head: Normocephalic and atraumatic.  Eyes: Pupils are equal, round, and reactive to light. Right eye exhibits no discharge. Left eye exhibits no discharge.  Neck: Normal range of motion. No thyromegaly present.  Cardiovascular: Normal rate, regular rhythm and normal heart sounds.   No murmur heard. Pulmonary/Chest: Effort normal and breath sounds normal. No respiratory distress. She has no wheezes. She has no rales.  Abdominal: Soft. She exhibits no distension. There is no  tenderness. There is no rebound.  Genitourinary: No breast swelling (no nodules or lumps in breast), tenderness, discharge or bleeding.  Musculoskeletal: She exhibits no edema.  Neurological: She is alert.  Skin: No rash noted.  Psychiatric: She has a normal mood and affect. Her behavior is normal.          Assessment & Plan:

## 2011-09-27 ENCOUNTER — Encounter: Payer: Self-pay | Admitting: Family Medicine

## 2011-09-28 DIAGNOSIS — B351 Tinea unguium: Secondary | ICD-10-CM | POA: Insufficient documentation

## 2011-09-28 DIAGNOSIS — Z0000 Encounter for general adult medical examination without abnormal findings: Secondary | ICD-10-CM | POA: Insufficient documentation

## 2011-09-28 DIAGNOSIS — M79603 Pain in arm, unspecified: Secondary | ICD-10-CM | POA: Insufficient documentation

## 2011-09-28 NOTE — Assessment & Plan Note (Addendum)
Pt has fungal infection of nail and foot.  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  Will obtain liver enzymes at todays visit.  If wnl will start pt on po antifungal. Pt also has athlete's foot- will treat with lamisil cream bid x 2 weeks.

## 2011-09-28 NOTE — Assessment & Plan Note (Signed)
PHQ-2 negative.  Pt to continue to meet with therapist.  Return if any return of symptoms of depression.

## 2011-09-28 NOTE — Assessment & Plan Note (Signed)
Not sure of cause of patient symptoms.  Do not feel that symptoms indicated any dangerous pathology.  Pt to monitor symptoms and return if new or worsening.

## 2011-09-28 NOTE — Assessment & Plan Note (Signed)
Health maintenance: Patient has had 3 negative Pap smears-last Pap smear negative June 2012. Blood pressure-105/64 Patient takes multivitamin daily Uses condoms for birth control Has one sexual partner Depression screen-negative

## 2011-10-28 ENCOUNTER — Telehealth: Payer: Self-pay | Admitting: Family Medicine

## 2011-10-28 NOTE — Telephone Encounter (Signed)
Pt is asking to have the Lamisil cream called back to [facility] [address] - she didn't get there in time to pick it up and they put it back

## 2011-10-28 NOTE — Telephone Encounter (Signed)
[facility] has med on file and will fill it today. Patient informed she can pick med up from pharmacy later today.  Gaylene Brooks, RN

## 2012-09-21 ENCOUNTER — Ambulatory Visit (INDEPENDENT_AMBULATORY_CARE_PROVIDER_SITE_OTHER): Payer: Self-pay | Admitting: Family Medicine

## 2012-09-21 ENCOUNTER — Encounter: Payer: Self-pay | Admitting: Family Medicine

## 2012-09-21 VITALS — BP 116/75 | HR 76 | Temp 98.1°F | Ht 62.5 in | Wt 120.7 lb

## 2012-09-21 DIAGNOSIS — R300 Dysuria: Secondary | ICD-10-CM | POA: Insufficient documentation

## 2012-09-21 LAB — POCT URINALYSIS DIPSTICK
Bilirubin, UA: NEGATIVE
Ketones, UA: NEGATIVE
Leukocytes, UA: NEGATIVE
Spec Grav, UA: 1.01

## 2012-09-21 NOTE — Assessment & Plan Note (Signed)
Urine with pink color and mild dysuria, however UA negative. Denies any vaginal bleeding.  Possibly related to something she is ingesting.  Advised to stay adequately hydrated and  return if not improving

## 2012-09-21 NOTE — Progress Notes (Signed)
  Subjective:    Patient ID: Audrey Jensen, female    DOB: 1975-12-14, 37 y.o.   MRN: [id number]  HPI  SUBJECTIVE: Audrey Jensen is a 37 y.o. female who complains of pink urine and dysuria x 3 days, without flank pain, fever, chills, or abnormal vaginal discharge or bleeding.   Review of Systems Per HPI    Objective:   Physical Exam  OBJECTIVE: Appears well, in no apparent distress.  Vital signs are normal. The abdomen is soft without tenderness, guarding, mass, rebound or organomegaly. No CVA tenderness or inguinal adenopathy noted.      Assessment & Plan:

## 2012-09-21 NOTE — Patient Instructions (Addendum)
Thank you for coming in today, it was good to see you Your urinalysis does not show that you have a urinary tract infection Try to stay hydrated and if symptoms do not improved please return.

## 2012-11-20 ENCOUNTER — Ambulatory Visit: Payer: Self-pay

## 2012-11-25 ENCOUNTER — Ambulatory Visit: Payer: Self-pay

## 2013-03-29 ENCOUNTER — Ambulatory Visit (INDEPENDENT_AMBULATORY_CARE_PROVIDER_SITE_OTHER): Payer: No Typology Code available for payment source | Admitting: Family Medicine

## 2013-03-29 ENCOUNTER — Other Ambulatory Visit (HOSPITAL_COMMUNITY)
Admission: RE | Admit: 2013-03-29 | Discharge: 2013-03-29 | Disposition: A | Discharge status: Home / Self Care | Payer: No Typology Code available for payment source | Source: Ambulatory Visit | Attending: Family Medicine | Admitting: Family Medicine

## 2013-03-29 ENCOUNTER — Encounter: Payer: Self-pay | Admitting: Family Medicine

## 2013-03-29 VITALS — BP 101/70 | HR 84 | Temp 98.0°F | Ht 62.5 in | Wt 124.0 lb

## 2013-03-29 DIAGNOSIS — Z13220 Encounter for screening for lipoid disorders: Secondary | ICD-10-CM

## 2013-03-29 DIAGNOSIS — Z124 Encounter for screening for malignant neoplasm of cervix: Secondary | ICD-10-CM

## 2013-03-29 DIAGNOSIS — Z01419 Encounter for gynecological examination (general) (routine) without abnormal findings: Secondary | ICD-10-CM | POA: Insufficient documentation

## 2013-03-29 DIAGNOSIS — Z23 Encounter for immunization: Secondary | ICD-10-CM

## 2013-03-29 DIAGNOSIS — D649 Anemia, unspecified: Secondary | ICD-10-CM

## 2013-03-29 DIAGNOSIS — Z113 Encounter for screening for infections with a predominantly sexual mode of transmission: Secondary | ICD-10-CM | POA: Insufficient documentation

## 2013-03-29 DIAGNOSIS — Z1151 Encounter for screening for human papillomavirus (HPV): Secondary | ICD-10-CM | POA: Insufficient documentation

## 2013-03-29 LAB — HIV ANTIBODY (ROUTINE TESTING W REFLEX): HIV: NONREACTIVE

## 2013-03-29 LAB — LIPID PANEL
Cholesterol: 151 mg/dL (ref 0–200)
HDL: 40 mg/dL (ref >39)
Total CHOL/HDL Ratio: 3.8 Ratio
Triglycerides: 157 mg/dL — ABNORMAL HIGH (ref <150)
VLDL: 31 mg/dL (ref 0–40)

## 2013-03-29 LAB — CBC
HCT: 36.7 % (ref 36.0–46.0)
Hemoglobin: 12.8 g/dL (ref 12.0–15.0)
MCH: 28.6 pg (ref 26.0–34.0)
MCHC: 34.9 g/dL (ref 30.0–36.0)
MCV: 81.9 fL (ref 78.0–100.0)
RDW: 13.5 % (ref 11.5–15.5)

## 2013-03-29 LAB — POCT WET PREP (WET MOUNT)
Clue Cells Wet Prep Whiff POC: NEGATIVE
WBC, Wet Prep HPF POC: >20

## 2013-03-29 MED ORDER — FAMOTIDINE 20 MG PO TABS: 20.0000 mg | ORAL_TABLET | Freq: Two times a day (BID) | ORAL | Status: AC

## 2013-03-29 NOTE — Patient Instructions (Signed)
It was nice to meet you today!  I sent in a medicine called pepcid for your stomach. Let's see if this helps. Return in 2 months so we can see how you are doing.  I will call you if your test results are not normal.  Otherwise, I will send you a letter.  If you do not hear from me with in 2 weeks please call our office.      See tips below on ways to stay healthy.  Be well, Dr. Pollie Meyer    Mantenimiento de la salud en las mujeres (Health Maintenance, Females) Un estilo de vida saludable y los cuidados preventivos pueden favorecer la salud y [place].   Haga exmenes regulares de la salud en general, dentales y de los ojos.  Consuma una dieta saludable. Los [employer], frutas, granos enteros, productos lcteos descremados y protenas magras contienen los nutrientes que usted necesita sin necesidad de consumir muchas caloras. Disminuya el consumo de alimentos con alto contenido de grasas slidas, azcar y sal agregadas. Si es necesario, pdale informacin acerca de [place] a su mdico.  La actividad fsica regular es una de las cosas ms importantes que puede hacer por su salud. Los adultos deben hacer al menos 150 minutos de ejercicios de intensidad moderada (cualquier actividad que aumente la frecuencia cardaca y lo haga transpirar) cada semana. Adems, la [employer]ley[employer]son de los adultos necesita ejercicios de fortalecimiento muscular 2  ms [employer].   Mantenga un peso saludable. El ndice de masa corporal [facility]) es una herramienta que identifica posibles problemas con [place]. Proporciona una estimacin de la grasa corporal basndose en el peso y la altura. El mdico podr determinar su [facility] y podr ayudarlo a Personnel officer o Pharmacologist un peso saludable. Para los adultos de 20 aos o ms:  Un [facility] menor a 18,5 se considera bajo peso.  Un [facility] entre 18,5 y 24,9 es normal.  Un [facility] entre 25 y 29,9 es sobrepeso.  Un IMC entre 30 o ms es obesidad.  Mantenga un nivel  normal de lpidos y colesterol en sangre practicando actividad fsica y minimizando la ingesta de grasas saturadas. Consuma una dieta balanceada e incluya variedad de frutas y vegetales. Los [employer] de lpidos y Oncologist en sangre deben Games developer a los 20 aos y repetirse cada 5 aos. Si los niveles de colesterol son altos, tiene ms de 50 aos o tiene riesgo elevado de sufrir enfermedades cardacas, Pension scheme manager controlarse con ms frecuencia.Si tiene [employer] de lpidos y colesterol, debe recibir tratamiento con medicamentos, si la dieta y el ejercicio no son efectivos.  Si fuma, consulte con [employer] de las opciones para dejar de [place]. Si no lo hace, no comience.  Se recomienda realizar controles para el cncer de pulmn a los [employer] de 55 a 80 aos que tengan alto riesgo de Environmental education officer la enfermedad debido a sus antecedentes de tabaquismo. Se recomienda realizar una tomografa computarizada (TC) anual de dosis bajas en aquellas personas tienen un promedio de 30 paquetes al ao de historia de tabaquismo y en la actualidad fuman o han dejado de fumar dentro de los ltimos 15 aos. Un paquete al ao de fumador es fumar un promedio de 1 paquete de cigarrillos al da durante 1 ao (por ejemplo: 1 paquete al da durante 30 aos o dos paquetes al da durante 15 aos). El control anual debe continuar hasta que el fumador haya dejado de fumar durante al menos 15 aos. Screening anual  debe interrumpirse en aquellas personas que desarrollan un problema de salud que les impida seguir un tratamiento para el cncer de pulmn.  Si est embarazada no beba alcohol. Si est amamantando, beba alcohol con prudencia. Si elige beber alcohol, no se exceda de 1 medida por da. Se considera una medida a 12 onzas (355 ml) de cerveza, 5 onzas (148 ml) de vino, o 1,5 onzas (44 ml) de licor.  No comparta agujas con nadie. Pida ayuda si necesita asistencia o instrucciones con respecto a abandonar el  consumo de alcohol, cigarrillos o drogas.  La hipertensin arterial causa enfermedades cardacas y [place] el riesgo de ictus. Debe controlar su presin arterial al menos cada 1 o 2 aos. La presin arterial elevada que persiste debe tratarse con medicamentos si la prdida de peso y el ejercicio no son efectivos.  Si tiene entre 55 y 109 aos, consulte a su mdico si debe tomar aspirina para prevenir enfermedades cardacas.  Los anlisis para la diabetes incluyen la toma de [place] de sangre para controlar el nivel de azcar en la sangre durante el [place]. Debe hacerlo cada 3 aos despus de los 45 aos si est dentro de su peso normal y sin factores de riesgo para la diabetes. Las pruebas deben comenzar a edades tempranas o llevarse a cabo con ms frecuencia si tiene sobrepeso y al menos 1 factor de riesgo para la diabetes.  Las evaluaciones para Market researcher de mama son un mtodo preventivo fundamental para las mujeres. Debe practicar la "autoconciencia de las mamas". Esto significa que Product/process development scientist apariencia normal de sus mamas y [employer] siente y pudiendo incluir un autoexamen de Building control surveyor. Si detecta algn cambio, no importa cun pequeo sea, debe informarlo a su mdico. Las mujeres entre 20 y 40 aos deben hacer un examen clnico de las mamas como parte del examen regular de [place], cada 1 a 3 aos. Despus de los 40 aos deben [employer] Nextel [employer]. Deben hacerse una mamografa radografa de mamas ) cada ao, comenzando a los 40 aos. Las mujeres con Brewing technologist de cncer de mama deben hablar con el mdico para hacer un estudio gentico. Las que tienen ms riesgo deben Geographical information systems officer resonancia magntica y [employer] todos [place].  La evaluacin del riesgo de sufrir cncer relacionado con el cncer de mama gentico ([name]) se recomienda en aquellas mujeres que tienen familiares con cncer relacionados con el [name] El cncer relacionado con [name] incluye el cncer de mama, de ovario, de  trompas y peritoneal. El hecho de tener familiares con estos tipos de cncer puede asociarse con un mayor riesgo de a sufrir cambios perjudiciales (mutaciones) en los genes para el cncer de mama [name] y [name]. Los resultados de la evaluacin determinar la necesidad de asesoramiento gentico [name] y [name] y [place]. Los resultados de la evaluacin determinarn la necesidad de asesoramiento gentico y estudios para el [name] y [name].  Un [place] se realiza para diagnosticar cncer de cuello de tero. Las mujeres deben hacerse un Papanicolau a Glass blower/designer de los [address] [employer] 21 y los 29 aos debe repetirse [address] Luego de los 30 aos, debe realizarse un Papanicolau cada tres aos siempre que los 3 estudios anteriores sean normales. Si le han realizado una histerectoma por un problema que no era cncer u otra enfermedad que podra causar cncer, ya no necesitar un Papanicolau. Si tiene entre 65 y 74 aos y ha tenido Heritage manager normal en los ltimos 10 aos,  ya no ser necesario realizarlo. Si ha recibido un tratamiento para Management consultant cervical o para una enfermedad que podra causar cncer, Musician un Papanicolau y controles durante al menos 20 aos de concluir el [place]. Si no se ha Patent attorney con regularidad, Writer a evaluarse los factores de riesgo (como el tener un nuevo compaero sexual) para Occupational psychologist a Printmaker. Algunas mujeres sufren problemas mdicos que aumentan la probabilidad de Research officer, political party cervical. En estos casos, el mdico podr indicar que se realice el Papanicolau con ms frecuencia.  La prueba del virus del Geneticist, molecular (VPH) es un anlisis adicional que puede usarse para Engineer, site de cuello de tero. Esta prueba busca la presencia del virus que causa los cambios en el cuello. Las [employer] se recolectan durante el [place]au pueden usarse para el VPH. La prueba para el VPH puede usarse para Development worker, community a  mujeres de ms de 30 aos y debe usarse en mujeres de cualquier edad Cisco del Papan[place]au no sean claros. Despus de los 30 aos, las mujeres deben hacerse el anlisis para el VPH con la misma frecuencia que el [place].  El cncer colorectal puede detectarse y con frecuencia puede prevenirse. La mayor parte de los estudios de rutina comienzan a los 50 aos y [employer]orne 75 aos. Sin embargo, el mdico podr aconsejarle que lo haga antes, si tiene factores de riesgo para el cncer de colon. Una vez por ao, el profesional le dar un kit de prueba para Scientist, product/process development en la materia fecal. La utilizacin de un tubo con una pequea cmara en su extremo para examinar directamente el colon (sigmoidoscopa o colonoscopa), puede detectar formas temprana de cncer colorectal. Hable con su mdico si tiene 50 aos, cuando comience con los estudios de [place]. El examen directo del colon debe repetirse cada 5 a 10 aos, hasta los 75 aos, excepto que se encuentren formas tempranas de plipos precancerosos o pequeos bultos.  Se recomienda realizar un anlisis de sangre para Engineer, manufacturing hepatitis C a todas las personas [address] 1945 y 1965, y a todo aquel que tenga un riesgo conocido de haber contrado esta enfermedad.  Practique el sexo seguro. Use condones y evite las prcticas sexuales riesgosas para disminuir el contagio de enfermedades de transmisin sexual. Las mujeres sexualmente activas de 25 aos o menos deben controlarse para descartar clamidia, que es una infeccin de transmisin sexual frecuente. Las [employer] que tengan mltiples compaeros tambin deben hacerse el anlisis para Engineer, manufacturing clamidia. Se recomienda realizar anlisis para detectar otras enfermedades de transmisin sexual si es sexualmente [place] y tiene riesgos.  La osteoporosis es una enfermedad en la que los huesos pierden los minerales y la fuerza por el avance de la edad. El resultado pueden ser fracturas  graves en los [employer]. El riesgo de osteoporosis puede identificarse con Neomia Dear prueba de densidad sea. Las mujeres de ms de 65 aos y las que tengan riesgos de sufrir fracturas u osteoporosis deben pedir consejo a su mdico. Consulte a su mdico si debe tomar un suplemento de calcio o de vitamina D para reducir el riesgo de osteoporosis.  La menopausia se asocia a sntomas y riesgos fsicos. Se dispone de una terapia de reemplazo hormonal para disminuir los sntomas y [place]. Consulte a su mdico para saber si la terapia de reemplazo hormonal es conveniente para usted.  Use una pantalla solar. Aplique pantalla de Pietro Cassis y repetida a lo largo del Futures trader.  Pngase al resguardo del sol cuando la sombra sea ms pequea que usted. Protjase usando mangas y [employer], un sombrero de ala ancha y gafas para el sol todo el ao, siempre que se encuentre en el exterior.  Informe a su mdico si aparecen nuevos lunares o los que tiene se modifican, especialmente en forma y color. Tambin notifique al mdico si un lunar es ms grande que el tamao de una goma de Paramedic.  Mantngase al da con las vacunas. Document Released: 03/07/2011 Document Revised: 07/13/2012 [facility] Patient Information 2014 [place], [place].

## 2013-03-29 NOTE — Progress Notes (Signed)
Patient ID: Audrey Jensen, female   DOB: 08-27-1975, 37 y.o.   MRN: [id number]  HPI:  Well woman visit: Pt presents for well woman exam today. She gets periods monthly. She denies having any vaginal discharge. She has been sexually active with the same female partner for 16 years. She is open to the idea of being pregnant but is not actively trying. She uses condoms most of the time but not every time.   Abdominal pain: occasionally has pain in her stomach in the epigastric area. It occurs when she eats spicy foods or if she eats too much. It happens about 2-3 times per week. It is not severe, but is noticeable. She also gets the pain if she doesn't eat. She took pepcid before and it helped some. Denies having any fevers.   ROS: See HPI  PMFSH: healthy, no significant past medical hx. Does not smoke or do drugs. Occasional alcohol. Occasionally sad this time of year because a lot of her family is in [place] and she misses them. Denies SI/HI.   PHYSICAL EXAM: BP 101/70  Pulse 84  Temp(Src) 98 F (36.7 C) (Oral)  Ht 5' 2.5" (1.588 m)  Wt 124 lb (56.246 kg)  BMI 22.30 kg/m2  LMP 03/23/2013 Gen: NAD HEENT: PERRL, thyroid without masses or nodules, normal in size, neck supple, no anterior cervical lymphadenopathy Heart: RRR, no murmurs Lungs: CTAB, NWOB Abdomen: soft. Mild epigastric tenderness. No distension, organomegaly, or masses. Normoactive BS.  Neuro: grossly nonfocal, speech intact Ext: No appreciable lower extremity edema bilaterally  Psych: normal range of affect, well groomed, speech normal in rate and volume, normal eye contact GU: normal appearing external genitalia. Vaginal moist without significant discharge. Cervix is visualized using speculum and is very anterior in position. No tenderness with bimanual exam. No adnexal masses or cervical motion tenderness.  ASSESSMENT/PLAN:  # Health maintenance:  -pap smear with HPV co test done today. If negative cytology & HPV,  will repeat in 5 years. -check lipid panel to screen for hyperlipidemia (note pt did have small amount of food so triglycerides may be inaccurate) -flu shot given today  -STD screening today: HIV, RPR, gc/chlamydia, wet prep -handout given on health maintenance  # Mild epigastric pain: -likely related to GERD given worsening with spicy foods and relationship with eating generally - will do trial of H2 blocker and f/u in 2 months to reassess pain, pt agreeable with this plan. - check CBC today to monitor Hgb (has hx of anemia, not currently on iron therapy)  FOLLOW UP: F/u in 2 months for GERD.  [place] J. Pollie Meyer, MD [facility]

## 2013-04-05 ENCOUNTER — Telehealth: Payer: Self-pay | Admitting: Family Medicine

## 2013-04-05 NOTE — Telephone Encounter (Signed)
[facility] red team, please call pt and let her know that all of her labwork (including STD testing) was normal. Her pap was also normal and I recommend she repeat it in 5 years.  Thanks! Latrelle DodrillBrittany J Brittnay Pigman, MD

## 2013-04-05 NOTE — Telephone Encounter (Signed)
Left message for patient to return call. Please read message from Dr McIntyre.[place], [place]  

## 2013-04-06 NOTE — Telephone Encounter (Signed)
Pt returned call and was given her results. Pt understood. jw

## 2013-04-06 NOTE — Telephone Encounter (Signed)
Called pt. LMVM to call back. Please see Dr.McIntyre's message. Thanks. .Audrey Jensen  

## 2013-05-13 ENCOUNTER — Ambulatory Visit (INDEPENDENT_AMBULATORY_CARE_PROVIDER_SITE_OTHER): Payer: Self-pay | Admitting: Family Medicine

## 2013-05-13 ENCOUNTER — Encounter: Payer: Self-pay | Admitting: Family Medicine

## 2013-05-13 VITALS — BP 113/73 | HR 80 | Temp 99.1°F | Ht 62.5 in | Wt 126.0 lb

## 2013-05-13 DIAGNOSIS — K120 Recurrent oral aphthae: Secondary | ICD-10-CM

## 2013-05-13 HISTORY — DX: Recurrent oral aphthae: K12.0

## 2013-05-13 MED ORDER — TRIAMCINOLONE ACETONIDE 0.1 % MT PSTE: PASTE | OROMUCOSAL | Status: DC

## 2013-05-13 NOTE — Assessment & Plan Note (Addendum)
1 aphthous ulceration on the L inner lip mucosa measuring around 0.2 cm, not bleeding at this time.  No previous history of ulcerations and denies any fever, chills, sweats.  Is having difficulty with PO intake secondary to pain but denies any other lesions.  Most likely minor aphthous ulcer that will tx with Kenalog paste BID to TID along with local topical anesthetics.  If no improvement or continues to develop, a trial of PO B12 is an option and would further evaluate for possible underlying systemic etiologies including but not limited to HIV, Bechet's, Pemphigoid, IBD, Celiac.  F/U PRN.

## 2013-05-13 NOTE — Progress Notes (Signed)
Audrey Jensen is a 38 y.o. female who presents today for mouth ulcer.  1 aphthous ulceration on the L inner lip mucosa measuring around 0.2 cm, not bleeding at this time.  No previous history of ulcerations and denies any fever, chills, sweats.  Is having difficulty with PO intake secondary to pain but denies any other lesions.   No past medical history on file.  History  Smoking status  . Never Smoker   Smokeless tobacco  . Never Used    No family history on file.  Current Outpatient Prescriptions on File Prior to Visit  Medication Sig Dispense Refill  . famotidine (PEPCID) 20 MG tablet Take 1 tablet (20 mg total) by mouth 2 (two) times daily.  60 tablet  2   No current facility-administered medications on file prior to visit.    ROS: Per HPI.  All other systems reviewed and are negative.   Physical Exam Filed Vitals:   05/13/13 1453  BP: 113/73  Pulse: 80  Temp: 99.1 F (37.3 C)    Physical Examination: General appearance - alert, well appearing, and in no distress Nose - normal and patent, no erythema, discharge or polyps Mouth - MMM, 2 mm erythematous grayish based ulceration at the L mucosa of the inferior lip.  No other lesions appreciated in the oral cavity Neck - supple, no significant adenopathy

## 2013-05-13 NOTE — Patient Instructions (Signed)
Canker Sores  Canker sores are painful, open sores on the inside of the mouth and cheek. They may be white or yellow. The sores usually heal in 1 to 2 weeks. Women are more likely than men to have recurrent canker sores. CAUSES The cause of canker sores is not well understood. More than one cause is likely. Canker sores do not appear to be caused by certain types of germs (viruses or bacteria). Canker sores may be caused by:  An allergic reaction to certain foods.  Digestive problems.  Not having enough vitamin B12, folic acid, and iron.  Female sex hormones. Sores may come only during certain phases of a menstrual cycle. Often, there is improvement during pregnancy.  Genetics. Some people seem to inherit canker sore problems. Emotional stress and injuries to the mouth may trigger outbreaks, but not cause them.  DIAGNOSIS Canker sores are diagnosed by exam.  TREATMENT  Patients who have frequent bouts of canker sores may have cultures taken of the sores, blood tests, or allergy tests. This helps determine if their sores are caused by a poor diet, an allergy, or some other preventable or treatable disease.  Vitamins may prevent recurrences or reduce the severity of canker sores in people with poor nutrition.  Numbing ointments can relieve pain. These are available in drug stores without a prescription.  Anti-inflammatory steroid mouth rinses or gels may be prescribed by your caregiver for severe sores.  Oral steroids may be prescribed if you have severe, recurrent canker sores. These strong medicines can cause many side effects and should be used only under the close direction of a dentist or physician.  Mouth rinses containing the antibiotic medicine may be prescribed. They may lessen symptoms and speed healing. Healing usually happens in about 1 or 2 weeks with or without treatment. Certain antibiotic mouth rinses given to pregnant women and young children can permanently stain teeth.  Talk to your caregiver about your treatment. HOME CARE INSTRUCTIONS   Avoid foods that cause canker sores for you.  Avoid citrus juices, spicy or salty foods, and coffee until the sores are healed.  Use a soft-bristled toothbrush.  Chew your food carefully to avoid biting your cheek.  Apply topical numbing medicine to the sore to help relieve pain.  Apply a thin paste of baking soda and water to the sore to help heal the sore.  Only use mouth rinses or medicines for pain or discomfort as directed by your caregiver. SEEK MEDICAL CARE IF:   Your symptoms are not better in 1 week.  Your sores are still present after 2 weeks.  Your sores are very painful.  You have trouble breathing or swallowing.  Your sores come back frequently. Document Released: 07/13/2010 Document Revised: 07/13/2012 Document Reviewed: 07/13/2010 [employer] Patient Information 2014 [employer].  

## 2013-05-17 ENCOUNTER — Ambulatory Visit: Payer: Self-pay | Admitting: Family Medicine

## 2013-05-19 ENCOUNTER — Ambulatory Visit: Payer: No Typology Code available for payment source

## 2013-06-07 ENCOUNTER — Ambulatory Visit (INDEPENDENT_AMBULATORY_CARE_PROVIDER_SITE_OTHER): Payer: No Typology Code available for payment source | Admitting: Family Medicine

## 2013-06-07 VITALS — BP 100/67 | HR 90 | Ht 62.5 in | Wt 127.5 lb

## 2013-06-07 DIAGNOSIS — M7710 Lateral epicondylitis, unspecified elbow: Secondary | ICD-10-CM

## 2013-06-07 NOTE — Patient Instructions (Signed)
Lateral Epicondylitis (Tennis Elbow) with Rehab Lateral epicondylitis involves inflammation and pain around the outer portion of the elbow. The pain is caused by inflammation of the tendons in the forearm that bring back (extend) the wrist. Lateral epicondylittis is also called tennis elbow, because it is very common in tennis players. However, it may occur in any individual who extends the wrist repetitively. If lateral epicondylitis is left untreated, it may become a chronic problem. SYMPTOMS   Pain, tenderness, and inflammation on the outer (lateral) side of the elbow.  Pain or weakness with gripping activities.  Pain that increases with wrist twisting motions (playing tennis, using a screwdriver, opening a door or a jar).  Pain with lifting objects, including a coffee cup. CAUSES  Lateral epicondylitis is caused by inflammation of the tendons that extend the wrist. Causes of injury may include:  Repetitive stress and strain on the muscles and tendons that extend the wrist.  Sudden change in activity level or intensity.  Incorrect grip in racquet sports.  Incorrect grip size of racquet (often too large).  Incorrect hitting position or technique (usually backhand, leading with the elbow).  Using a racket that is too heavy. RISK INCREASES WITH:  Sports or occupations that require repetitive and/or strenuous forearm and wrist movements (tennis, squash, racquetball, carpentry).  Poor wrist and forearm strength and flexibility.  Failure to warm up properly before activity.  Resuming activity before healing, rehabilitation, and conditioning are complete. PREVENTION   Warm up and stretch properly before activity.  Maintain physical fitness:  Strength, flexibility, and endurance.  Cardiovascular fitness.  Wear and use properly fitted equipment.  Learn and use proper technique and have a coach correct improper technique.  Wear a tennis elbow (counterforce) brace. PROGNOSIS   The course of this condition depends on the degree of the injury. If treated properly, acute cases (symptoms lasting less than 4 weeks) are often resolved in 2 to 6 weeks. Chronic (longer lasting cases) often resolve in 3 to 6 months, but may require physical therapy. RELATED COMPLICATIONS   Frequently recurring symptoms, resulting in a chronic problem. Properly treating the problem the first time decreases frequency of recurrence.  Chronic inflammation, scarring tendon degeneration, and partial tendon tear, requiring surgery.  Delayed healing or resolution of symptoms. TREATMENT  Treatment first involves the use of ice and medicine, to reduce pain and inflammation. Strengthening and stretching exercises may help reduce discomfort, if performed regularly. These exercises may be performed at home, if the condition is an acute injury. Chronic cases may require a referral to a physical therapist for evaluation and treatment. Your caregiver may advise a corticosteroid injection, to help reduce inflammation. Rarely, surgery is needed. MEDICATION  If pain medicine is needed, nonsteroidal anti-inflammatory medicines (aspirin and ibuprofen), or other minor pain relievers (acetaminophen), are often advised.  Do not take pain medicine for 7 days before surgery.  Prescription pain relievers may be given, if your caregiver thinks they are needed. Use only as directed and only as much as you need.  Corticosteroid injections may be recommended. These injections should be reserved only for the most severe cases, because they can only be given a certain number of times. HEAT AND COLD  Cold treatment (icing) should be applied for 10 to 15 minutes every 2 to 3 hours for inflammation and pain, and immediately after activity that aggravates your symptoms. Use ice packs or an ice massage.  Heat treatment may be used before performing stretching and strengthening activities prescribed by your   caregiver, physical  therapist, or athletic trainer. Use a heat pack or a warm water soak. SEEK MEDICAL CARE IF: Symptoms get worse or do not improve in 2 weeks, despite treatment. EXERCISES  RANGE OF MOTION (ROM) AND STRETCHING EXERCISES - Epicondylitis, Lateral (Tennis Elbow) These exercises may help you when beginning to rehabilitate your injury. Your symptoms may go away with or without further involvement from your physician, physical therapist or athletic trainer. While completing these exercises, remember:   Restoring tissue flexibility helps normal motion to return to the joints. This allows healthier, less painful movement and activity.  An effective stretch should be held for at least 30 seconds.  A stretch should never be painful. You should only feel a gentle lengthening or release in the stretched tissue. RANGE OF MOTION  Wrist Flexion, Active-Assisted  Extend your right / left elbow with your fingers pointing down.*  Gently pull the back of your hand towards you, until you feel a gentle stretch on the top of your forearm.  Hold this position for __________ seconds. Repeat __________ times. Complete this exercise __________ times per day.  *If directed by your physician, physical therapist or athletic trainer, complete this stretch with your elbow bent, rather than extended. RANGE OF MOTION  Wrist Extension, Active-Assisted  Extend your right / left elbow and turn your palm upwards.*  Gently pull your palm and fingertips back, so your wrist extends and your fingers point more toward the ground.  You should feel a gentle stretch on the inside of your forearm.  Hold this position for __________ seconds. Repeat __________ times. Complete this exercise __________ times per day. *If directed by your physician, physical therapist or athletic trainer, complete this stretch with your elbow bent, rather than extended. STRETCH - Wrist Flexion  Place the back of your right / left hand on a tabletop,  leaving your elbow slightly bent. Your fingers should point away from your body.  Gently press the back of your hand down onto the table by straightening your elbow. You should feel a stretch on the top of your forearm.  Hold this position for __________ seconds. Repeat __________ times. Complete this stretch __________ times per day.  STRETCH  Wrist Extension   Place your right / left fingertips on a tabletop, leaving your elbow slightly bent. Your fingers should point backwards.  Gently press your fingers and palm down onto the table by straightening your elbow. You should feel a stretch on the inside of your forearm.  Hold this position for __________ seconds. Repeat __________ times. Complete this stretch __________ times per day.  STRENGTHENING EXERCISES - Epicondylitis, Lateral (Tennis Elbow) These exercises may help you when beginning to rehabilitate your injury. They may resolve your symptoms with or without further involvement from your physician, physical therapist or athletic trainer. While completing these exercises, remember:   Muscles can gain both the endurance and the strength needed for everyday activities through controlled exercises.  Complete these exercises as instructed by your physician, physical therapist or athletic trainer. Increase the resistance and repetitions only as guided.  You may experience muscle soreness or fatigue, but the pain or discomfort you are trying to eliminate should never worsen during these exercises. If this pain does get worse, stop and make sure you are following the directions exactly. If the pain is still present after adjustments, discontinue the exercise until you can discuss the trouble with your caregiver. STRENGTH Wrist Flexors  Sit with your right / left forearm palm-up and   fully supported on a table or countertop. Your elbow should be resting below the height of your shoulder. Allow your wrist to extend over the edge of the  surface.  Loosely holding a __________ weight, or a piece of rubber exercise band or tubing, slowly curl your hand up toward your forearm.  Hold this position for __________ seconds. Slowly lower the wrist back to the starting position in a controlled manner. Repeat __________ times. Complete this exercise __________ times per day.  STRENGTH  Wrist Extensors  Sit with your right / left forearm palm-down and fully supported on a table or countertop. Your elbow should be resting below the height of your shoulder. Allow your wrist to extend over the edge of the surface.  Loosely holding a __________ weight, or a piece of rubber exercise band or tubing, slowly curl your hand up toward your forearm.  Hold this position for __________ seconds. Slowly lower the wrist back to the starting position in a controlled manner. Repeat __________ times. Complete this exercise __________ times per day.  STRENGTH - Ulnar Deviators  Stand with a ____________________ weight in your right / left hand, or sit while holding a rubber exercise band or tubing, with your healthy arm supported on a table or countertop.  Move your wrist, so that your pinkie travels toward your forearm and your thumb moves away from your forearm.  Hold this position for __________ seconds and then slowly lower the wrist back to the starting position. Repeat __________ times. Complete this exercise __________ times per day STRENGTH - Radial Deviators  Stand with a ____________________ weight in your right / left hand, or sit while holding a rubber exercise band or tubing, with your injured arm supported on a table or countertop.  Raise your hand upward in front of you or pull up on the rubber tubing.  Hold this position for __________ seconds and then slowly lower the wrist back to the starting position. Repeat __________ times. Complete this exercise __________ times per day. STRENGTH  Forearm Supinators   Sit with your right /  left forearm supported on a table, keeping your elbow below shoulder height. Rest your hand over the edge, palm down.  Gently grip a hammer or a soup ladle.  Without moving your elbow, slowly turn your palm and hand upward to a "thumbs-up" position.  Hold this position for __________ seconds. Slowly return to the starting position. Repeat __________ times. Complete this exercise __________ times per day.  STRENGTH  Forearm Pronators   Sit with your right / left forearm supported on a table, keeping your elbow below shoulder height. Rest your hand over the edge, palm up.  Gently grip a hammer or a soup ladle.  Without moving your elbow, slowly turn your palm and hand upward to a "thumbs-up" position.  Hold this position for __________ seconds. Slowly return to the starting position. Repeat __________ times. Complete this exercise __________ times per day.  STRENGTH - Grip  Grasp a tennis ball, a dense sponge, or a large, rolled sock in your hand.  Squeeze as hard as you can, without increasing any pain.  Hold this position for __________ seconds. Release your grip slowly. Repeat __________ times. Complete this exercise __________ times per day.  STRENGTH - Elbow Extensors, Isometric  Stand or sit upright, on a firm surface. Place your right / left arm so that your palm faces your stomach, and it is at the height of your waist.  Place your opposite hand on   the underside of your forearm. Gently push up as your right / left arm resists. Push as hard as you can with both arms, without causing any pain or movement at your right / left elbow. Hold this stationary position for __________ seconds. Gradually release the tension in both arms. Allow your muscles to relax completely before repeating. Document Released: 03/18/2005 Document Revised: 06/10/2011 Document Reviewed: 06/30/2008 [employer] Patient Information 2014 [employer].  

## 2013-06-07 NOTE — Assessment & Plan Note (Signed)
Lateral epicondylitis of the left elbow status post hitting her elbow on her car door. -Conservative management discussed including rest, ice, ibuprofen as needed for pain, and gentle stretching and exercise routine -Patient also counseled to purchase over-the-counter elbow brace for tennis elbow -Patient to return in 2-3 weeks if symptoms persist -No plan for imaging at this time however could consider imaging if symptoms persist

## 2013-06-07 NOTE — Progress Notes (Signed)
   Subjective:    Patient ID: Audrey Jensen, female    DOB: 19-Jan-1976, 38 y.o.   MRN: [id number]  HPI 38 year old female presents for two-week history of the left elbow pain, patient states that 2 weeks ago she was putting her daughter into the car and hit her elbow against the car, she did have some mild swelling in the area for a few days, no bruising of the area, since that time she has been having pain in the lateral aspect of her arm especially with movement, patient does report intermittent numbness of her bilateral hands however this is unrelated to her current injury, the numbness occurs mostly in the morning, she works in a dry cleaning business and has not been able to take time off from her job, she has been taking ibuprofen 400 mg every 6-8 hours which has provided minimal relief of her symptoms she has been using heat and ice sparingly to the area, she denies weakness   Review of Systems  Constitutional: Negative for fever, chills and fatigue.  Respiratory: Negative for cough, choking and shortness of breath.   Skin: Negative for pallor, rash and wound.       Objective:   Physical Exam Vitals: Reviewed General: Pleasant Hispanic female, no acute distress WUJ:[id number]:tenderness over the lateral upper condyle extending into the wrist extensors, range of motion to flexion and extension of the elbow is full, range of motion to wrist flexion and extension is full, pain is reproduced with wrist extension, strength is 5 out of 5 to elbow flexion and extension as well as wrist flexion and extension, negative Tinel's, negative Phalen's sign Vascular: Normal capillary refill in bilateral hands, 2+ radial pulse of the left upper extremity Skin: No bruising, no wound, no swelling       Assessment & Plan:  Please see problem specific assessment and plan.

## 2013-10-04 ENCOUNTER — Ambulatory Visit (INDEPENDENT_AMBULATORY_CARE_PROVIDER_SITE_OTHER): Payer: No Typology Code available for payment source | Admitting: Family Medicine

## 2013-10-04 VITALS — BP 101/70 | HR 78 | Temp 98.2°F | Wt 123.7 lb

## 2013-10-04 DIAGNOSIS — L989 Disorder of the skin and subcutaneous tissue, unspecified: Secondary | ICD-10-CM

## 2013-10-04 HISTORY — DX: Disorder of the skin and subcutaneous tissue, unspecified: L98.9

## 2013-10-04 MED ORDER — SULFAMETHOXAZOLE-TMP DS 800-160 MG PO TABS: 1.0000 | ORAL_TABLET | Freq: Two times a day (BID) | ORAL | Status: DC

## 2013-10-04 MED ORDER — DOXYCYCLINE HYCLATE 100 MG PO TABS: 100.0000 mg | ORAL_TABLET | Freq: Two times a day (BID) | ORAL | Status: DC

## 2013-10-04 NOTE — Assessment & Plan Note (Signed)
Skin lesion suspicious for but bite with secondary superinfection versus tinea given the physical exam. Discussed that most likely she has a secondary bacterial infection after an arthropod bite. Treatment Bactrim DS one twice a day x7 days, initially prescribed doxycycline but given that she's cash paying for medicines a change to Bactrim Discussed possibility of tenia corporis and advised twice a day Lamisil x2 weeks if the lesion does not resolve with antibiotics. Discussed in detail reasons for followup and recommended followup with PCP for likely  tinea unguium.

## 2013-10-04 NOTE — Patient Instructions (Signed)
Great to meet you!  I think you have a bacterial infection but I am very suspicious for the fungal infection we discussed.   Use Lamisil (terbinafine) cream on the red areas twice a day for 2 weeks.   Finish all of the antibiotics.

## 2013-10-04 NOTE — Progress Notes (Signed)
Patient ID: Audrey Jensen, female   DOB: [birth year], 38 y.o.   MRN: [id number]  Kevin FentonSamuel Treena Cosman, MD Phone: [phone]  Subjective:  Chief complaint-noted  Pt Here for lesion on her leg  Skin lesion- started 4 weeks ago Patient states that the lesion started while she was in the garden after she felt a pinching sensation that she thinks might have been a spider bite  The area was red and swollen for the next 7 days or so and he got very itchy for a few weeks. She states it was different than a mosquito bite that she doesn't get many mosquito bites. After the swelling went down and began to itch she scratched vigorously for several days she states. After that she's developed a blister with clear fluid which popped. She now has a couple of other red spots one below the lesion, and 1 about 8 inches above the lesion which she is concerned was spread from the clear fluid.   she now denies bleeding but states that it continues to itch. She's used hydrocortisone which is helped the itch. She's also covered with Neosporin.   Thickened toenails States that she's concerned she has a toenail fungus and that she's been told before that she needs a fungal culture to start medication for treatment. Her nails are painted today and so she can't show me.  ROS-  No fever, chills, sweats No decreased appetite No dyspnea And chest pain   Past Medical History Patient Active Problem List   Diagnosis Date Noted  . Skin lesion 10/04/2013  . Lateral epicondylitis  of elbow 06/07/2013  . Ulcer aphthous oral 05/13/2013  . Dysuria 09/21/2012  . Health maintenance examination 09/28/2011  . Arm pain 09/28/2011  . MAJOR DEPRESSIVE DISORDER RECURRENT EPISODE MILD 12/14/2008    Medications- reviewed and updated Current Outpatient Prescriptions  Medication Sig Dispense Refill  . famotidine (PEPCID) 20 MG tablet Take 1 tablet (20 mg total) by mouth 2 (two) times daily.  60 tablet  2  .  sulfamethoxazole-trimethoprim (BACTRIM DS) 800-160 MG per tablet Take 1 tablet by mouth 2 (two) times daily.  20 tablet  0   No current facility-administered medications for this visit.    Objective: BP 101/70  Pulse 78  Temp(Src) 98.2 F (36.8 C) (Oral)  Wt 123 lb 11.2 oz (56.11 kg)  LMP 09/10/2013 Gen: NAD, alert, cooperative with exam HEENT: NCAT Ext: No edema, warm Neuro: Alert and oriented, No gross deficits Skin: Left leg with approximately 4 cm erythematous circular lesion lesion with small papules in the peripheral part of the lesion in the central area of heme crusting/scabbing.   very small (1-3 mm) erythematous macule 1 cm beneath the larger one described in about 8 inches above the primary lesion.    Assessment/Plan:  Skin lesion Skin lesion suspicious for but bite with secondary superinfection versus tinea given the physical exam. Discussed that most likely she has a secondary bacterial infection after an arthropod bite. Treatment Bactrim DS one twice a day x7 days, initially prescribed doxycycline but given that she's cash paying for medicines a change to Bactrim Discussed possibility of tenia corporis and advised twice a day Lamisil x2 weeks if the lesion does not resolve with antibiotics. Discussed in detail reasons for followup and recommended followup with PCP for likely  tinea unguium.     Meds ordered this encounter  Medications  . DISCONTD: doxycycline (VIBRA-TABS) 100 MG tablet    Sig: Take 1 tablet (100 mg total)  by mouth 2 (two) times daily.    Dispense:  20 tablet    Refill:  0  . sulfamethoxazole-trimethoprim (BACTRIM DS) 800-160 MG per tablet    Sig: Take 1 tablet by mouth 2 (two) times daily.    Dispense:  20 tablet    Refill:  0

## 2014-01-31 ENCOUNTER — Encounter: Payer: Self-pay | Admitting: Family Medicine

## 2014-11-02 ENCOUNTER — Ambulatory Visit (INDEPENDENT_AMBULATORY_CARE_PROVIDER_SITE_OTHER): Payer: Self-pay | Admitting: Family Medicine

## 2014-11-02 VITALS — BP 114/74 | HR 79 | Temp 98.5°F | Ht 62.5 in | Wt 127.4 lb

## 2014-11-02 DIAGNOSIS — G510 Bell's palsy: Secondary | ICD-10-CM

## 2014-11-02 HISTORY — DX: Bell's palsy: G51.0

## 2014-11-02 MED ORDER — PREDNISONE 20 MG PO TABS: 60.0000 mg | ORAL_TABLET | Freq: Every day | ORAL | Status: DC

## 2014-11-02 MED ORDER — HYPROMELLOSE (GONIOSCOPIC) 2.5 % OP SOLN: 1.0000 [drp] | Freq: Three times a day (TID) | OPHTHALMIC | Status: DC

## 2014-11-02 NOTE — Patient Instructions (Signed)
Thank you for coming in,   I think that you have Bell's palsy. If your symptoms don't resolve after treatment with prednisone in 7 days then please return.   If you develop any weakness in your legs or arms, if you are unable to have normal speech or you have numbness in your arms or legs then you need to be seen in the Emergency Department.   Please bring all of your medications with you to each visit.    Please feel free to call with any questions or concerns at any time, at [phone]. --Dr. Al Decant de Bell (Bell's Palsy) La parlisis de Bell es un trastorno en el que los msculos de un lado de la cara no pueden moverse (parlisis). Esto se debe a que los nervios de la cara estn paralizados. Se cree que es producido por un virus. El virus causa inflamacin del nervio que controla los movimientos de un lado de la cara. El nervio pasa a travs de un espacio angosto, rodeado de [place]. Cuando el nervio se inflama, el hueso puede comprimirlo. Esto da [employer] un dao a la cubierta protectora que rodea [place]. Este dao interfiere con la forma en la que el nervio se comunica con los msculos de la cara. Como [place], puede causar debilidad o parlisis en los msculos faciales.  Las lesiones (traumatismos), tumores y [place] pueden causar parlisis de Armed forces logistics/support/administrative officer, pero la [place] de las veces se desconoce la causa. Es un trastorno bastante frecuente. Comienza de repente (aparicin abrupta) y la parlisis por lo general desaparece en 2 das. La parlisis de Bell no es peligrosa. Pero como el ojo no se cierra adecuadamente, necesitar tomar algunos recaudos para que no se seque. Esto puede incluir un vendaje (mantener el ojo cerrado) o humedecerlo con Biomedical scientist. Rara vez ocurre en ambos lados al [employer]. SNTOMAS  Ceja cada.  Cada del ojo y comisura de la boca.  Incapacidad para cerrar un ojo.  Prdida del sentido del gusto en la parte anterior de la  lengua.  Sensibilidad a ruidos fuertes. TRATAMIENTO El tratamiento por lo general no es quirrgico. Si el paciente fue atendido dentro de las primeras 24 a 48 horas puede que se le haya prescrito un tratamiento corto con esteroides para intentar acortar el curso de la enfermedad. Tambin se podrn Chemical engineer medicamentos antivirales junto con los esteroides, pero no est claro si son tiles.  Necesitar proteger el ojo si no puede cerrarlo. La crnea (cubierta transparente del ojo) podra secarse y daarse. Se podrn utilizar lgrimas artificiales para mantener el ojo lubricado. Se debern utilizar lentes o parches para proteger el ojo. PRONSTICO El tiempo de recuperacin es variable y puede tardar desde [address] a varios meses. Aunque en general se cura completamente, (en un 80% de los casos aproximadamente), las consecuencias no pueden predecirse. La [employer] de las personas mejoran dentro de las 3 semanas del comienzo de los sntomas. Las mejoras debern continuar por entre 3 y 6 meses. Una pequea cantidad de personas presentan debilidad moderada a grave que es [place].  INSTRUCCIONES PARA EL CUIDADO DOMICILIARIO  Si su mdico le prescribe medicamentos para controlar la inflamacin del nervio, tmela de la manera indicada. No suspenda los medicamentos a menos que se lo haya indicado el profesional que le asiste.  Utilice gotas oculares para [employer] ojos y prevenir la sequedad, de la manera en que se le indique.  Proteja sus ojos de la [employer] se lo indique el  profesional que [employer].  Practique masajes faciales y ejercicios de la manera que se lo indique el profesional que le asiste.  Realice sus actividades normales y procure un descanso regular. SOLICITE ATENCIN MDICA DE INMEDIATO SI:  Presenta dolor, enrojecimiento o irritacin en el ojo.  Usted o su nio tienen una temperatura oral de ms de 38,9 C (102 F) y no puede controlarla con medicamentos. EST SEGURO  QUE:   Comprende las instrucciones para el alta mdica.  Controlar su enfermedad.  Solicitar atencin mdica de inmediato segn las indicaciones. Document Released: 03/18/2005 Document Revised: 06/10/2011 [facility] Patient Information 2015 [place], [place]. This information is not intended to replace advice given to you by your health care provider. Make sure you discuss any questions you have with your health care provider.

## 2014-11-02 NOTE — Assessment & Plan Note (Signed)
Most likely bell's palsy based on symptoms and exam  TM's clear and no suggestion of herpes infection  - Prednisone 60 mg for 7 days  - artificial tears provided  - given indications for seeking immediate care in ED  - discussed with Dr. Mauricio Po.

## 2014-11-02 NOTE — Progress Notes (Signed)
   Subjective:    Patient ID: Audrey Jensen, female    DOB: Sep 05, 1975, 39 y.o.   MRN: [id number]  Seen for Same day visit for   CC: facial drooping   She noticed that her face was drooping when she was brushing her teeth this AM.  She was drooling and water was spilling out of her mouth.  This started at 6:30am.  Denies a tick bite or recent viral illness.  Denies any blurry vision, double vision, fever, chills, night sweats, weakness, trouble speaking, pain with extra ocular movements. Does endorse an occipital headache.    Review of Systems   See HPI for ROS. Objective:  BP 114/74 mmHg  Pulse 79  Temp(Src) 98.5 F (36.9 C) (Oral)  Ht 5' 2.5" (1.588 m)  Wt 127 lb 6.4 oz (57.788 kg)  BMI 22.92 kg/m2  LMP 10/18/2014  General: NAD HEENT: TM's clear and intact,  Cardiac: RRR, normal heart sounds, no murmurs. Respiratory: CTAB, normal effort Neuro: flattened nasolabial fold on left side Unable to hold her eye lid shut on left but able to close it.  EOMI and PERRL All other CN tested normal Normal finger to nose  Normal rapid alternating hand movements,  normal heel to shin b/l  Normal grip strength b/l  5/5 strength in UE and LE b/l  Normal sensation in UE and LE b/l  +2 patellar DTR's b/l  Normal plantar and dorsiflexion b/l  Normal gait        Assessment & Plan:  See Problem List Documentation

## 2015-01-05 ENCOUNTER — Emergency Department (INDEPENDENT_AMBULATORY_CARE_PROVIDER_SITE_OTHER): Payer: Self-pay

## 2015-01-05 ENCOUNTER — Encounter (HOSPITAL_COMMUNITY): Payer: Self-pay | Admitting: *Deleted

## 2015-01-05 ENCOUNTER — Emergency Department (INDEPENDENT_AMBULATORY_CARE_PROVIDER_SITE_OTHER)
Admission: EM | Admit: 2015-01-05 | Discharge: 2015-01-05 | Disposition: A | Discharge status: Home / Self Care | Payer: Self-pay | Source: Home / Self Care

## 2015-01-05 DIAGNOSIS — M65351 Trigger finger, right little finger: Secondary | ICD-10-CM

## 2015-01-05 MED ORDER — TERBINAFINE HCL 250 MG PO TABS: 250.0000 mg | ORAL_TABLET | Freq: Every day | ORAL | Status: DC

## 2015-01-05 NOTE — ED Provider Notes (Signed)
CSN: [id number]     Arrival date & time 01/05/15  1301 History   None    Chief Complaint  Patient presents with  . Rash   (Consider location/radiation/quality/duration/timing/severity/associated sxs/prior Treatment) Patient is a 39 y.o. female presenting with rash and hand pain. The history is provided by the patient.  Rash Location:  Toe Toe rash location:  R little toe Quality: itchiness   Severity:  Mild Duration:  2 weeks Progression:  Worsening Chronicity:  New Relieved by:  None tried Worsened by:  Nothing tried Ineffective treatments:  None tried Associated symptoms: no fever   Hand Pain This is a new problem. The current episode started more than 1 week ago (slammed in door to right 5th finger 3 wks ago. now locking with flexion.).    History reviewed. No pertinent past medical history. History reviewed. No pertinent past surgical history. History reviewed. No pertinent family history. Social History  Substance Use Topics  . Smoking status: Never Smoker   . Smokeless tobacco: Never Used  . Alcohol Use: No   OB History    Gravida Para Term Preterm AB TAB SAB Ectopic Multiple Living   0 1   0 0 2      Obstetric Comments   [id number]     Review of Systems  Constitutional: Negative for fever.  Musculoskeletal: Positive for joint swelling.  Skin: Positive for rash. Negative for wound.  All other systems reviewed and are negative.   Allergies  Review of patient's allergies indicates no known allergies.  Home Medications   Prior to Admission medications   Medication Sig Start Date End Date Taking? Authorizing Provider  hydroxypropyl methylcellulose / hypromellose (ISOPTO TEARS / GONIOVISC) 2.5 % ophthalmic solution Place 1 drop into both eyes 3 (three) times daily. 11/02/14   Myra Rude, MD  predniSONE (DELTASONE) 20 MG tablet Take 3 tablets (60 mg total) by mouth daily with breakfast. 11/02/14   Myra Rude, MD  sulfamethoxazole-trimethoprim  (BACTRIM DS) 800-160 MG per tablet Take 1 tablet by mouth 2 (two) times daily. 10/04/13   Elenora Gamma, MD  terbinafine (LAMISIL) 250 MG tablet Take 1 tablet (250 mg total) by mouth daily. 01/05/15   Linna Hoff, MD   Meds Ordered and Administered this Visit  Medications - No data to display  BP 111/70 mmHg  Pulse 78  Temp(Src) 98.4 F (36.9 C) (Oral)  Resp 14  SpO2 100%  LMP 01/01/2015 No data found.   Physical Exam  Constitutional: She appears well-developed and well-nourished.  Skin: Skin is warm and dry. Rash noted. Rash is macular.     Nursing note and vitals reviewed.   ED Course  Procedures (including critical care time)  Labs Review Labs Reviewed - No data to display  Imaging Review Dg Finger Little Right  01/05/2015   CLINICAL DATA:  Acute right fifth finger pain after injury in door 3 weeks ago. Initial encounter.  EXAM: RIGHT LITTLE FINGER 2+V  COMPARISON:  None.  FINDINGS: There is no evidence of fracture or dislocation. There is no evidence of arthropathy or other focal bone abnormality. Soft tissues are unremarkable.  IMPRESSION: Normal right fifth finger.   Electronically Signed   By: Lupita Raider, M.D.   On: 01/05/2015 14:14   X-rays reviewed and report per radiologist.   Visual Acuity Review  Right Eye Distance:   Left Eye Distance:   Bilateral Distance:    Right Eye Near:  Left Eye Near:    Bilateral Near:         MDM   1. Trigger little finger, right    Finger splint   Linna Hoff, MD 01/05/15 1431

## 2015-01-05 NOTE — ED Notes (Signed)
Pt  Has  Dry   Rash  On  Both  Feet  Between  Her  Toes      X  2  Weeks       pt  Also  Reports    She injured  Her  r  Pinky     3  Weeks  Ago      Hit a  door

## 2015-01-05 NOTE — Discharge Instructions (Signed)
Wear finger splint until seen next week by orthopedist for recheck, take all of medicine for foot rash.

## 2015-01-05 NOTE — ED Notes (Signed)
Finger  Splinted   In pof

## 2015-01-06 ENCOUNTER — Emergency Department (HOSPITAL_COMMUNITY)
Admission: EM | Admit: 2015-01-06 | Discharge: 2015-01-06 | Disposition: A | Discharge status: Home / Self Care | Payer: Self-pay | Source: Home / Self Care

## 2015-01-06 ENCOUNTER — Encounter (HOSPITAL_COMMUNITY): Payer: Self-pay | Admitting: *Deleted

## 2015-01-06 NOTE — ED Notes (Signed)
Pt   Here  To  See  Dr  Audrey Jensen           Seen  Last  Pm    Here  Today  For  followup

## 2015-01-06 NOTE — Discharge Instructions (Signed)
Take over-the-counter Naproxen 2 tablets in the morning and 2 in the evening for 3 weeks.   Trigger Finger Trigger finger (digital tendinitis and stenosing tenosynovitis) is a common disorder that causes an often painful catching of the fingers or thumb. It occurs as a clicking, snapping, or locking of a finger in the palm of the hand. This is caused by a problem with the tendons that flex or bend the fingers sliding smoothly through their sheaths. The condition may occur in any finger or a couple fingers at the same time.  The finger may lock with the finger curled or suddenly straighten out with a snap. This is more common in patients with rheumatoid arthritis and diabetes. Left untreated, the condition may get worse to the point where the finger becomes locked in flexion, like making a fist, or less commonly locked with the finger straightened out. CAUSES   Inflammation and scarring that lead to swelling around the tendon sheath.  Repeated or forceful movements.  Rheumatoid arthritis, an autoimmune disease that affects joints.  Gout.  Diabetes mellitus. SIGNS AND SYMPTOMS  Soreness and swelling of your finger.  A painful clicking or snapping as you bend and straighten your finger. DIAGNOSIS  Your health care provider will do a physical exam of your finger to diagnose trigger finger. TREATMENT   Splinting for 6-8 weeks may be helpful.  Nonsteroidal anti-inflammatory medicines (NSAIDs) can help to relieve the pain and inflammation.  Cortisone injections, along with splinting, may speed up recovery. Several injections may be required. Cortisone may give relief after one injection.  Surgery is another treatment that may be used if conservative treatments do not work. Surgery can be minor, without incisions (a cut does not have to be made), and can be done with a needle through the skin.  Other surgical choices involve an open procedure in which the surgeon opens the hand through a  small incision and cuts the pulley so the tendon can again slide smoothly. Your hand will still work fine. HOME CARE INSTRUCTIONS  Apply ice to the injured area, twice per day:  Put ice in a plastic bag.  Place a towel between your skin and the bag.  Leave the ice on for 20 minutes, 3-4 times a day.  Rest your hand often. MAKE SURE YOU:   Understand these instructions.  Will watch your condition.  Will get help right away if you are not doing well or get worse.   This information is not intended to replace advice given to you by your health care provider. Make sure you discuss any questions you have with your health care provider.   Document Released: 01/06/2004 Document Revised: 11/18/2012 Document Reviewed: 08/18/2012 Elsevier Interactive Patient Education 2016 [name].  [employer] en los puntos gatillo  (Trigger Point Injection)  Los puntos gatillo son reas donde se siente dolor muscular. Esta inyeccin se aplica en ese punto para Engineer, materials. Un punto gatillo puede sentirse como un nudo en el msculo. Duele al presionarlo. A veces el dolor se extiende (irradia) a otras partes del cuerpo. Por ejemplo, al pulsar sobre un punto gatillo en el hombro puede causar dolor en el brazo o en el cuello. Es posible que tenga un punto gatillo. O bien, que tenga ms de uno. Generalmente se sienten en la espalda y la zona lumbar. Ocurren tambin con frecuencia en el cuello y los hombros. Este dolor puede durar [place]. Puede dificultar los movimientos. Tal vez le impida hacer ejercicios o  la terapia fsica que podra ayudarlo a Engineer, materials. Una inyeccin en los puntos gatillo lo puede ayudar. No proporciona alivio a [employer]. Sin embargo, puede Engineer, materials por [employer] o unos meses. Una inyeccin en los puntos gatillo no cura el dolor de larga duracin (crnico).  INFORME A SU MDICO SOBRE:   Cualquier alergia (especialmente al ltex, lidocana o  corticoides).  Si toma medicamentos anticoagulantes. Estos medicamentos pueden causar sangrado o hematoma despus de una inyeccin. Ellos son:  Aspirina.  Ibuprofeno.  Clopidogrel.  Warfarina.  Otros medicamentos que toma. Se incluyen vitaminas, hierbas, gotas oftlmicas, medicamentos de venta libre y cremas.  Uso de corticoides.  Infecciones recientes.  Problemas anteriores debido a anestsicos.  Problemas hemorrgicos.  Cirugas previas.  Otros problemas de Britt. RIESGOS Y COMPLICACIONES  Una inyeccin en un punto gatillo es un tratamiento seguro. Sin embargo pueden producirse algunos problemas, por ejemplo:   Efectos secundarios leves que desaparecen en 1 a 2 das. Estos pueden ser:  Engineer, mining.  Hematomas.  Rigidez.  Los problemas ms graves son raros. Pueden incluir:  Hemorragias debajo de la piel (hematomas).  Infeccin de la piel.  Ruptura de la aguja bajo la piel.  Puncin pulmonar.  La inyeccin de puntos gatillo puede no darle resultado. ANTES DEL PROCEDIMIENTO  Es posible que tenga que dejar de tomar todos los [employer]. Esto es para prevenir el sangrado y los hematomas. Por lo general, estos medicamentos deben suspenderse varios das antes de la inyeccin. No se necesita otro tipo de preparacin.  PROCEDIMIENTO  Una inyeccin de puntos gatillo se puede Barista de su mdico o en una clnica. Cada inyeccin demora 2 minutos o menos.   Su mdico palpar los puntos gatillo. El mdico puede usar un marcador para Web designer rea en que debe aplicar la inyeccin.  La piel sobre el punto gatillo se lava con una solucin desinfectante (antisptico).  El mdico pincha en el lugar de la inyeccin.  Ladonna Snide una [employer]. Podr sentir dolor o una sensacin de tironeo cuando la aguja entra en el punto gatillo.  Podrn inyectarle una solucin con anestsico en el punto gatillo. En otros casos se inyecta un medicamento  para aliviar la hinchazn, el enrojecimiento y Airline pilot (inflamacin).  Su mdico mover la Set designer alrededor del punto gatillo hasta que la tensin y los espasmos desaparezcan.  Despus de la inyeccin, su mdico podra aplicar una presin [employer] zona.  Luego la cubrir con un vendaje. DESPUS DEL PROCEDIMIENTO   Podr volver a su casa despus del procedimiento.  Despus de unas pocas horas podr retirar el vendaje.  Es posible que Stage manager y rigidez durante 1 a 2 das.  Vuelva a sus actividades normales lentamente. Su mdico puede pedirle que [employer]. No haga nada que necesite energa extra [employer].  Siga las instrucciones de su mdico para el control y el tratamiento de otros dolores.   Esta informacin no tiene Theme park manager el consejo del mdico. Asegrese de hacerle al mdico cualquier pregunta que tenga.   Document Released: 12/11/2011 Document Revised: 07/13/2012 Elsevier Interactive Patient Education [employer]

## 2015-01-06 NOTE — Consult Note (Signed)
ORTHOPAEDIC CONSULTATION HISTORY & PHYSICAL REQUESTING PHYSICIAN: Dr. Artis Flock  Chief Complaint: right small finger painful locking  HPI: Audrey Jensen is a 39 y.o. female who reports a 3-week history of painful locking of the right SF.  At first, she indicated it "must" have been related to striking her finger on a door/frame, but with more careful questioning, it appears that she cannot recall a single specific event such as that.  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  History reviewed. No pertinent past medical history. History reviewed. No pertinent past surgical history. Social History   Social History  . Marital Status: Single    Spouse Name: N/A  . Number of Children: N/A  . Years of Education: N/A   Social History Main Topics  . Smoking status: Never Smoker   . Smokeless tobacco: Never Used  . Alcohol Use: No  . Drug Use: No  . Sexual Activity: Yes    Birth Control/ Protection: Condom   Other Topics Concern  . None   Social History Narrative   Lives with the father of her daughter(Audrey Jensen born 4/05, Audrey Jensen born 2011.; Native of [place];    Boyfriend is a Corporate investment banker.    Works at [employer] at [employer];    No etoh, no tob.no drugs.    No family history on file. No Known Allergies Prior to Admission medications   Medication Sig Start Date End Date Taking? Authorizing Provider  hydroxypropyl methylcellulose / hypromellose (ISOPTO TEARS / GONIOVISC) 2.5 % ophthalmic solution Place 1 drop into both eyes 3 (three) times daily. 11/02/14   Myra Rude, MD  predniSONE (DELTASONE) 20 MG tablet Take 3 tablets (60 mg total) by mouth daily with breakfast. 11/02/14   Myra Rude, MD  sulfamethoxazole-trimethoprim (BACTRIM DS) 800-160 MG per tablet Take 1 tablet by mouth 2 (two) times daily. 10/04/13   Elenora Gamma, MD  terbinafine (LAMISIL) 250 MG tablet Take 1 tablet (250 mg total) by mouth daily. 01/05/15   Linna Hoff, MD   Dg Finger Little Right  01/05/2015   CLINICAL DATA:  Acute right fifth finger pain after injury in door 3 weeks ago. Initial encounter.  EXAM: RIGHT LITTLE FINGER 2+V  COMPARISON:  None.  FINDINGS: There is no evidence of fracture or dislocation. There is no evidence of arthropathy or other focal bone abnormality. Soft tissues are unremarkable.  IMPRESSION: Normal right fifth finger.   Electronically Signed   By: Lupita Raider, M.D.   On: 01/05/2015 14:14    Positive ROS: All other systems have been reviewed and were otherwise negative with the exception of those mentioned in the HPI and as above.  Physical Exam: Vitals: Refer to EMR. Constitutional:  WD, WN, NAD HEENT:  NCAT, EOMI Neuro/Psych:  Alert & oriented to person, place, and time; appropriate mood & affect Lymphatic: No generalized extremity edema or lymphadenopathy Extremities / MSK:  The extremities are normal with respect to appearance, ranges of motion, joint stability, muscle strength/tone, sensation, & perfusion except as otherwise noted:  R SF TTP at A1 pulley, locking with each flexion cycle  Assessment: R SF trigger digit  Plan: 1mL generic celestone injected at the level of the A1 pulley, 1/2 in the SQ space and 1/2 into the flexor sheath.  Instructed to take OTC aleve, 2 tabs BID for 3 weeks. Call to RTC my office if still not resolved in a month.  Activity  as tolerated.  Cliffton Asters Janee Morn, MD      Orthopaedic & Hand Surgery [facility] [address] [place], [name]  [address] Office: [phone] Mobile: [phone]

## 2015-01-06 NOTE — ED Notes (Signed)
Discharge  Instructions  Given to  Pt  By  Dr  Janee Morn

## 2015-01-09 ENCOUNTER — Telehealth: Payer: Self-pay | Admitting: Family Medicine

## 2015-01-18 ENCOUNTER — Ambulatory Visit: Payer: Self-pay

## 2015-04-24 ENCOUNTER — Telehealth: Payer: Self-pay | Admitting: Family Medicine

## 2015-04-24 ENCOUNTER — Ambulatory Visit (INDEPENDENT_AMBULATORY_CARE_PROVIDER_SITE_OTHER): Payer: Self-pay | Admitting: Family Medicine

## 2015-04-24 ENCOUNTER — Encounter: Payer: Self-pay | Admitting: Family Medicine

## 2015-04-24 ENCOUNTER — Other Ambulatory Visit (HOSPITAL_COMMUNITY)
Admission: RE | Admit: 2015-04-24 | Discharge: 2015-04-24 | Disposition: A | Discharge status: Home / Self Care | Payer: Self-pay | Source: Ambulatory Visit | Attending: Family Medicine | Admitting: Family Medicine

## 2015-04-24 VITALS — BP 108/66 | HR 77 | Temp 98.6°F | Ht 63.0 in | Wt 128.0 lb

## 2015-04-24 DIAGNOSIS — Z01419 Encounter for gynecological examination (general) (routine) without abnormal findings: Secondary | ICD-10-CM

## 2015-04-24 DIAGNOSIS — N9089 Other specified noninflammatory disorders of vulva and perineum: Secondary | ICD-10-CM

## 2015-04-24 DIAGNOSIS — Z113 Encounter for screening for infections with a predominantly sexual mode of transmission: Secondary | ICD-10-CM | POA: Insufficient documentation

## 2015-04-24 DIAGNOSIS — R300 Dysuria: Secondary | ICD-10-CM

## 2015-04-24 DIAGNOSIS — N898 Other specified noninflammatory disorders of vagina: Secondary | ICD-10-CM

## 2015-04-24 HISTORY — DX: Other specified noninflammatory disorders of vulva and perineum: N90.89

## 2015-04-24 LAB — POCT WET PREP (WET MOUNT): CLUE CELLS WET PREP WHIFF POC: NEGATIVE

## 2015-04-24 LAB — POCT URINALYSIS DIPSTICK
BILIRUBIN UA: NEGATIVE
GLUCOSE UA: NEGATIVE
Ketones, UA: NEGATIVE
Leukocytes, UA: NEGATIVE
NITRITE UA: NEGATIVE
Protein, UA: NEGATIVE
RBC UA: NEGATIVE
Spec Grav, UA: 1.025
Urobilinogen, UA: 0.2
pH, UA: 7

## 2015-04-24 MED ORDER — FLUCONAZOLE 150 MG PO TABS: 150.0000 mg | ORAL_TABLET | Freq: Once | ORAL | Status: DC

## 2015-04-24 NOTE — Telephone Encounter (Signed)
Pt informed. Deseree Blount, CMA  

## 2015-04-24 NOTE — Telephone Encounter (Signed)
She needs her next pap in 2019. She speaks good english

## 2015-04-24 NOTE — Patient Instructions (Signed)
Audrey Jensen (Health Maintenance, Female) Un estilo de vida saludable y los cuidados preventivos pueden favorecer considerablemente a la salud y Musician. Pregunte a su mdico cul es el cronograma de exmenes peridicos apropiado para usted. Esta es una buena oportunidad para consultarlo sobre cmo prevenir enfermedades y Audrey Jensen sano. Adems de los controles, hay muchas otras cosas que puede hacer usted mismo. Los expertos han realizado numerosas investigaciones Audrey Jensen cambios en el estilo de vida y las medidas de prevencin que, Audrey Jensen, lo ayudarn a mantenerse sano. Solicite a su mdico ms informacin. EL PESO Y LA DIETA  Consuma una dieta saludable.  Asegrese de [name] verduras, frutas, productos lcteos de bajo contenido de Audrey Jensen y [name].  No consuma muchos alimentos de alto contenido de grasas slidas, azcares agregados o sal.  Realice actividad fsica con regularidad. Esta es una de las prcticas ms importantes que puede hacer por su salud.  La mayora de los adultos deben hacer ejercicio durante al menos 173mnutos por semana. El ejercicio debe aumentar la frecuencia cardaca y pActorla transpiracin (ejercicio de iEnola.  La mayora de los adultos tambin deben hField seismologistejercicios de elongacin al mToysRusveces a la semana. Agregue esto al su plan de ejercicio de intensidad moderada. Mantenga un peso saludable.  El ndice de masa corporal ([facility] es una medida que puede utilizarse para identificar posibles problemas de pBelle Valley Proporciona una estimacin de la grasa corporal basndose en el peso y la altura. Su mdico puede ayudarle a dRadiation protection practitionerIAvenely a lScientist, forensico mTheatre managerun peso saludable.  Para las mujeres de 20aos o ms:  Un IMadera Ambulatory Endoscopy Centermenor de 18,5 se considera bajo peso.  Un IAbbeville Area Medical Centerentre 18,5 y 24,9 es normal.  Un IMethodist Hospitalentre 25 y 29,9 se considera sobrepeso.  Un IMC de 30 o ms se considera  obesidad. Observe los niveles de colesterol y lpidos en la sangre.  Debe comenzar a rEnglish as a second language teacherde lpidos y cResearch officer, trade unionen la sangre a los 20aos y luego repetirlos cada 513aos  Es posible que nAutomotive engineerlos niveles de colesterol con mayor frecuencia si:  Sus niveles de lpidos y colesterol son altos.  Es mayor de 50aos.  Presenta un alto riesgo de padecer enfermedades cardacas. DETECCIN DE CNCER  Cncer de pulmn  Se recomienda realizar exmenes de deteccin de cncer de pulmn a personas adultas entre 557y 833aos que estn en riesgo de dHorticulturist, commercialde pulmn por sus antecedentes de consumo de tabaco.  Se recomienda una tomografa computarizada de baja dosis de los pLiberty Mediaaos a las personas que:  Fuman actualmente.  Hayan dejado el hbito en algn momento en los ltimos 15aos.  Hayan fumado durante 30aos un paquete diario. Un paquete-ao equivale a fumar un promedio de un paquete de cigarrillos diario durante un ao.  Los exmenes de deteccin anuales deben continuar hasta que hayan pasado 15aos desde que dej de fumar.  Ya no debern realizarse si tiene un problema de salud que le impida recibir tratamiento para eScience writerde pulmn. Cncer de mama  Practique la autoconciencia de la mama. Esto significa reconocer la apariencia normal de sus mamas y cmo las siente.  Tambin significa realizar autoexmenes regulares de lJohnson & Audrey Informe a su mdico sobre cualquier cambio, sin importar cun pequeo sea.  Si tiene entre 20 y 31aos, un mdico debe realizarle un examen clnico de las mBrunswick Corporationparte del examen regular de sHohenwald cKent[place]  1 a 3aos.  Si tiene 40aos o ms, debe realizarse un examen clnico de las mamas todos los aos. Tambin considere realizarse una radiografa de las mamas (mamografa) todos los aos.  Si tiene antecedentes familiares de cncer de mama, hable con su mdico para someterse a un estudio  gentico.  Si tiene alto riesgo de padecer cncer de mama, hable con su mdico para someterse a una resonancia magntica y una mamografa todos los aos.  La evaluacin del gen del cncer de mama ([name]) se recomienda a mujeres que tengan familiares con cnceres relacionados con el [name]. Los cnceres relacionados con el [name] incluyen los siguientes:  Mama.  [name].  [name].  Cnceres de peritoneo.  Los resultados de la evaluacin determinarn la necesidad de asesoramiento gentico y de anlisis de [name] y [name]. Cncer de cuello del tero El mdico puede recomendarle que se haga pruebas peridicas de deteccin de cncer de los rganos de la pelvis (ovarios, tero y vagina). Estas pruebas incluyen un examen plvico, que abarca controlar si se produjeron cambios microscpicos en la superficie del cuello del tero (prueba de Papanicolaou). Pueden recomendarle que se haga estas pruebas cada 3aos, a partir de los 21aos.  A las mujeres que tienen entre 30 y 65aos, los mdicos pueden recomendarles que se sometan a exmenes plvicos y pruebas de Papanicolaou cada 3aos, o a la prueba de Papanicolaou y el examen plvico en combinacin con estudios de deteccin del virus del papiloma humano (VPH) cada 5aos. Algunos tipos de VPH aumentan el riesgo de padecer cncer de cuello del tero. La prueba para la deteccin del VPH tambin puede realizarse a mujeres de cualquier edad cuyos resultados de la prueba de Papanicolaou no sean claros.  Es posible que otros mdicos no recomienden exmenes de deteccin a mujeres no embarazadas que se consideran sujetos de bajo riesgo de padecer cncer de pelvis y que no tienen sntomas. Pregntele al mdico si un examen plvico de deteccin es adecuado para usted.  Si ha recibido un tratamiento para el cncer cervical o una enfermedad que podra causar cncer, necesitar realizarse una prueba de Papanicolaou y controles durante al menos 20 aos de concluido el  tratamiento. Si no se ha hecho el Papanicolaou con regularidad, debern volver a evaluarse los factores de riesgo (como tener un nuevo compaero sexual), para determinar si debe realizarse los estudios nuevamente. Algunas mujeres sufren problemas mdicos que aumentan la probabilidad de contraer cncer de cuello del tero. En estos casos, el mdico podr indicar que se realicen controles y pruebas de Papanicolaou con ms frecuencia. Cncer colorrectal  Este tipo de cncer puede detectarse y a menudo prevenirse.  Por lo general, los estudios de rutina se deben comenzar a hacer a partir de los 50 aos y hasta los 75 aos.  Sin embargo, el mdico podr aconsejarle que lo haga antes, si tiene factores de riesgo para el cncer de colon.  Tambin puede recomendarle que use un kit de prueba para hallar sangre oculta en la materia fecal.  Es posible que se use una pequea cmara en el extremo de un tubo para examinar directamente el colon (sigmoidoscopia o colonoscopia) a fin de detectar formas tempranas de cncer colorrectal.  Los exmenes de rutina generalmente comienzan a los 50aos.  El examen directo del colon se debe repetir cada 5 a 10aos hasta los 75aos. Sin embargo, es posible que se realicen exmenes con mayor frecuencia, si se detectan formas tempranas de plipos precancerosos o pequeos bultos. Cncer de piel  Revise   la piel de la cabeza a los pies con regularidad.  Informe a su mdico si aparecen nuevos lunares o los que tiene se modifican, especialmente en su forma y color.  Tambin notifique al mdico si tiene un lunar que es ms grande que el tamao de una goma de lpiz.  Siempre use pantalla solar. Aplique pantalla solar de Kerry Dory y repetida a lo largo del Training and development officer.  Protjase usando mangas y [employer], un sombrero de ala ancha y gafas para el sol, siempre que se encuentre en el exterior. ENFERMEDADES CARDACAS, DIABETES E HIPERTENSIN ARTERIAL   La hipertensin  arterial causa enfermedades cardacas y [place] el riesgo de ictus. La hipertensin arterial es ms probable en los siguientes casos:  Las personas que tienen la presin arterial en el extremo del rango normal (100-139/85-89 mm Hg).  Las personas con sobrepeso u obesidad.  Las Retail banker.  Si usted tiene entre 18 y 39 aos, debe medirse la presin arterial cada 3 a 5 aos. Si usted tiene 40 aos o ms, debe medirse la presin arterial [employer]. Debe medirse la presin arterial dos veces: una vez cuando est en un hospital o una clnica y la otra vez cuando est en otro sitio. Registre el promedio de [employer]. Para controlar su presin arterial cuando no est en un hospital o [place], puede usar lo siguiente:  Ardelia Mems mquina automtica para medir la presin arterial en una farmacia.  Un monitor para medir la presin arterial en el hogar.  Si tiene entre 33 y 105 aos, consulte a su mdico si debe tomar aspirina para prevenir el ictus.  Realcese exmenes de deteccin de la diabetes con regularidad. Esto incluye la toma de [place] de sangre para controlar el nivel de azcar en la sangre durante el [place].  Si tiene un peso normal y un bajo riesgo de padecer diabetes, realcese este anlisis cada tres aos despus de los 45aos.  Si tiene sobrepeso y un alto riesgo de padecer diabetes, considere someterse a este anlisis antes o con mayor frecuencia. PREVENCIN DE INFECCIONES  HepatitisB  Si tiene un riesgo ms alto de Museum/gallery curator hepatitis B, debe someterse a un examen de deteccin de este virus. Se considera que tiene un alto riesgo de Museum/gallery curator hepatitis B si:  Naci en un pas donde la hepatitis B es frecuente. Pregntele a su mdico qu pases son considerados de Public affairs consultant.  Sus padres nacieron en un pas de alto riesgo y usted no recibi una vacuna que lo proteja contra la hepatitis B (vacuna contra la hepatitis B).  [place]  [place] agujas  para inyectarse drogas.  Vive con alguien que tiene hepatitis B.  Ha tenido sexo con alguien que tiene hepatitis B.  Recibe tratamiento de hemodilisis.  Toma ciertos medicamentos para el cncer, trasplante de rganos y afecciones autoinmunitarias. Hepatitis C  Se recomienda un anlisis de [place] para:  Todos los que nacieron entre 1945 y [id number].  Todas las personas que tengan un riesgo de haber contrado hepatitis C. Enfermedades de transmisin sexual (ETS).  Debe realizarse pruebas de deteccin de enfermedades de transmisin sexual (ETS), incluidas gonorrea y clamidia si:  Es sexualmente activo y es menor de 24aos.  Es mayor de 24aos, y Investment banker, operational informa que corre riesgo de tener este tipo de infecciones.  La actividad sexual ha cambiado desde que le hicieron la ltima prueba de deteccin y tiene un riesgo mayor de Best boy clamidia o Radio broadcast assistant. Pregntele  al mdico si usted tiene riesgo.  Si no tiene el VIH, pero corre riesgo de infectarse por el virus, se recomienda tomar diariamente un medicamento recetado para evitar la infeccin. Esto se conoce como profilaxis previa a la exposicin. Se considera que est en riesgo si:  Es activo sexualmente y no usa preservativos habitualmente o no conoce el estado del VIH de sus parejas sexuales.  Se inyecta drogas.  Es activo sexualmente con una pareja que tiene VIH. Consulte a su mdico para saber si tiene un alto riesgo de infectarse por el VIH. Si opta por comenzar la profilaxis previa a la exposicin, primero debe realizarse anlisis de deteccin del VIH. Luego, le harn anlisis cada 3meses mientras est tomando los medicamentos para la profilaxis previa a la exposicin.  EMBARAZO   Si es premenopusica y puede quedar embarazada, solicite a su mdico asesoramiento previo a la concepcin.  Si puede quedar embarazada, tome 400 a 800microgramos (mcg) de cido flico todos los das.  Si desea evitar el embarazo, hable con su  mdico sobre el control de la natalidad (anticoncepcin). OSTEOPOROSIS Y MENOPAUSIA   La osteoporosis es una enfermedad en la que los huesos pierden los minerales y la fuerza por el avance de la edad. El resultado pueden ser fracturas graves en los huesos. El riesgo de osteoporosis puede identificarse con una prueba de densidad sea.  Si tiene 65aos o ms, o si est en riesgo de sufrir osteoporosis y fracturas, pregunte a su mdico si debe someterse a exmenes.  Consulte a su mdico si debe tomar un suplemento de calcio o de vitamina D para reducir el riesgo de osteoporosis.  La menopausia puede presentar ciertos sntomas fsicos y riesgos.  La terapia de reemplazo hormonal puede reducir algunos de estos sntomas y riesgos. Consulte a su mdico para saber si la terapia de reemplazo hormonal es conveniente para usted.  INSTRUCCIONES PARA EL CUIDADO EN EL HOGAR   Realcese los estudios de rutina de la salud, dentales y de la vista.  Mantngase al da con las vacunas.  No consuma ningn producto que contenga tabaco, lo que incluye cigarrillos, tabaco de mascar o cigarrillos electrnicos.  Si est embarazada, no beba alcohol.  Si est amamantando, reduzca el consumo de alcohol y la frecuencia con la que consume.  Si es mujer y no est embarazada limite el consumo de alcohol a no ms de 1 medida por da. Una medida equivale a 12onzas de cerveza, 5onzas de vino o 1onzas de bebidas alcohlicas de alta graduacin.  No consuma drogas.  No comparta agujas.  Solicite ayuda a su mdico si necesita apoyo o informacin para abandonar las drogas.  Informe a su mdico si a menudo se siente deprimido.  Notifique a su mdico si alguna vez ha sido vctima de abuso o si no se siente seguro en su hogar.   Esta informacin no tiene como fin reemplazar el consejo del mdico. Asegrese de hacerle al mdico cualquier pregunta que tenga.   Document Released: 03/07/2011 Document Revised:  04/08/2014 Elsevier Interactive Patient Education 2016 Elsevier Inc.  

## 2015-04-24 NOTE — Telephone Encounter (Signed)
Patient asks PCP when she would have a pap. Please, follow up with Patient (Spanish).

## 2015-04-25 LAB — CERVICOVAGINAL ANCILLARY ONLY
CHLAMYDIA, DNA PROBE: NEGATIVE
Neisseria Gonorrhea: NEGATIVE

## 2015-04-26 DIAGNOSIS — Z01419 Encounter for gynecological examination (general) (routine) without abnormal findings: Secondary | ICD-10-CM | POA: Insufficient documentation

## 2015-04-26 NOTE — Assessment & Plan Note (Signed)
White discharge and itching x3 days, some dysuria today, no odor - wet prep with many WBCs and bacteria but no clues or visible yeast, exam and history most consistent with yeast so treated with diflucan x1 - UA and GC/CT negative - pt to call if symptoms persistent past 2-3 days, would consider flagyl at that point

## 2015-04-26 NOTE — Progress Notes (Signed)
   Subjective:   Audrey Jensen is a 40 y.o. female with a history of bells palsy and depression here for well woman exam and vaginal discharge.  Pt is doing well overall. No mood symptoms currently, not being treated for depression at this time. She eats a well balanced diet with fruits, vegetables and protein. Walks 3-4 times weekly for exercise. No smoking, drinking or other drugs. Safe home situation. Only concern is vaginal discharge and itching for the past few days. She is not currently using birth control but would welcome a pregnancy should it happen (children are 15 and 5 and never used contraception before).  VAGINAL DISCHARGE  Having vaginal discharge for 3 days. Medications tried: none Discharge consistency: smooth Discharge color: white Recent antibiotic use: no Sex in last month: yes Possible STD exposure:doesn't think so, married and monogamous  Symptoms Fever: no Dysuria:some today Vaginal bleeding: no Abdomen or Pelvic pain: no Back pain: no Genital sores or ulcers:no Rash: no Pain during sex: no Missed menstrual period: no   Review of Systems:  Per HPI.   PMH, PSH, Medications, Allergies, and FmHx reviewed and updated in EMR.  Social History: never smoker  Objective:  BP 108/66 mmHg  Pulse 77  Temp(Src) 98.6 F (37 C) (Oral)  Ht  (1.6 m)  Wt 128 lb (58.06 kg)  BMI 22.68 kg/m2  LMP 04/05/2015  Gen:  40 y.o. female in NAD HEENT: NCAT, MMM, EOMI, PERRL, anicteric sclerae CV: RRR, no MRG, no JVD Resp: Non-labored, CTAB, no wheezes noted Abd: Soft, NTND, BS present, no guarding or organomegaly Ext: WWP, no edema MSK: Full ROM, strength intact Neuro: Alert and oriented, speech normal GU: moderate smooth white discharge, otherwise normal vagina, cervix, adnexa, no CMT      Chemistry      Component Value Date/Time   NA 136 09/26/2011 1659   K 3.9 09/26/2011 1659   CL 105 09/26/2011 1659   CO2 23 09/26/2011 1659   BUN 15  09/26/2011 1659   CREATININE 0.58 09/26/2011 1659   CREATININE 0.65 11/03/2006 2117      Component Value Date/Time   CALCIUM 8.8 09/26/2011 1659   ALKPHOS 45 09/26/2011 1659   AST 17 09/26/2011 1659   ALT 12 09/26/2011 1659   BILITOT 0.3 09/26/2011 1659      Lab Results  Component Value Date   WBC 7.1 03/29/2013   HGB 12.8 03/29/2013   HCT 36.7 03/29/2013   MCV 81.9 03/29/2013   PLT 279 03/29/2013   Lab Results  Component Value Date   TSH 2.818 09/26/2011   No results found for: HGBA1C Assessment & Plan:     [name] is a 40 y.o. female here for well woman exam  Well woman exam with routine gynecological exam Healthy 40 year old, well balanced diet, walks at least 3 days per week for 15-20 minutes, health maintenance UTD.  - Next pap due 2019 (negative cotest in 2014) - annual flu shots - tdap due 2021  Vaginal discharge White discharge and itching x3 days, some dysuria today, no odor - wet prep with many WBCs and bacteria but no clues or visible yeast, exam and history most consistent with yeast so treated with diflucan x1 - UA and GC/CT negative - pt to call if symptoms persistent past 2-3 days, would consider flagyl at that point   Beverely Low, MD, MPH [facility] PGY-3 04/26/2015 4:53 PM

## 2015-04-26 NOTE — Assessment & Plan Note (Addendum)
Healthy 40 year old, well balanced diet, walks at least 3 days per week for 15-20 minutes, health maintenance UTD.  - Next pap due 2019 (negative cotest in 2014) - annual flu shots - tdap due 2021

## 2015-04-27 ENCOUNTER — Telehealth: Payer: Self-pay | Admitting: *Deleted

## 2015-04-27 NOTE — Telephone Encounter (Signed)
-----   Message from Abram Sander, MD sent at 04/25/2015  5:23 PM EST ----- Please let patient know that gonorrhea and chlamydia were negative. She should call if the itching and discharge are not getting better so we can try another medicine. Thanks!

## 2015-04-27 NOTE — Telephone Encounter (Signed)
Patient informed, expressed understanding. 

## 2015-05-04 ENCOUNTER — Telehealth: Payer: Self-pay | Admitting: Family Medicine

## 2015-05-04 DIAGNOSIS — S025XXB Fracture of tooth (traumatic), initial encounter for open fracture: Secondary | ICD-10-CM

## 2015-05-04 NOTE — Telephone Encounter (Signed)
Patient asks for Dentist's referral ASAP. Please, follow up.

## 2015-06-11 ENCOUNTER — Emergency Department (INDEPENDENT_AMBULATORY_CARE_PROVIDER_SITE_OTHER)
Admission: EM | Admit: 2015-06-11 | Discharge: 2015-06-11 | Disposition: A | Discharge status: Home / Self Care | Payer: Self-pay | Source: Home / Self Care

## 2015-06-11 ENCOUNTER — Encounter (HOSPITAL_COMMUNITY): Payer: Self-pay | Admitting: *Deleted

## 2015-06-11 ENCOUNTER — Emergency Department (INDEPENDENT_AMBULATORY_CARE_PROVIDER_SITE_OTHER): Payer: Self-pay

## 2015-06-11 DIAGNOSIS — J111 Influenza due to unidentified influenza virus with other respiratory manifestations: Secondary | ICD-10-CM

## 2015-06-11 MED ORDER — OSELTAMIVIR PHOSPHATE 75 MG PO CAPS: 75.0000 mg | ORAL_CAPSULE | Freq: Two times a day (BID) | ORAL | Status: DC

## 2015-06-11 NOTE — ED Provider Notes (Signed)
CSN: [id number]     Arrival date & time 06/11/15  1333 History   None    Chief Complaint  Patient presents with  . Influenza   (Consider location/radiation/quality/duration/timing/severity/associated sxs/prior Treatment) Patient is a 40 y.o. female presenting with flu symptoms. The history is provided by the patient.  Influenza Presenting symptoms: cough, fever, myalgias, rhinorrhea and shortness of breath   Severity:  Moderate Onset quality:  Sudden Duration:  3 days Progression:  Worsening Relieved by:  Nothing Worsened by:  Drinking Ineffective treatments:  OTC medications Associated symptoms: chills and decreased appetite     History reviewed. No pertinent past medical history. History reviewed. No pertinent past surgical history. No family history on file. Social History  Substance Use Topics  . Smoking status: Never Smoker   . Smokeless tobacco: Never Used  . Alcohol Use: No   OB History    Gravida Para Term Preterm AB TAB SAB Ectopic Multiple Living   3 2 2  0 1   0 0 2      Obstetric Comments   [id number]     Review of Systems  Constitutional: Positive for fever, chills and decreased appetite.  HENT: Positive for postnasal drip and rhinorrhea.   Eyes: Negative.   Respiratory: Positive for cough and shortness of breath.   Cardiovascular: Negative.   Endocrine: Negative.   Genitourinary: Negative.   Musculoskeletal: Positive for myalgias.  Skin: Negative.   Allergic/Immunologic: Negative.   Neurological: Negative.   Hematological: Negative.   Psychiatric/Behavioral: Negative.     Allergies  Review of patient's allergies indicates no known allergies.  Home Medications   Prior to Admission medications   Medication Sig Start Date End Date Taking? Authorizing Provider  fluconazole (DIFLUCAN) 150 MG tablet Take 1 tablet (150 mg total) by mouth once. 04/24/15   Abram SanderElena M Adamo, MD  hydroxypropyl methylcellulose / hypromellose (ISOPTO TEARS / GONIOVISC) 2.5 %  ophthalmic solution Place 1 drop into both eyes 3 (three) times daily. 11/02/14   Myra RudeJeremy E Schmitz, MD   Meds Ordered and Administered this Visit  Medications - No data to display  BP 124/88 mmHg  Pulse 93  Temp(Src) 99.2 F (37.3 C) (Oral)  SpO2 98%  LMP 05/31/2015 (Exact Date) No data found.   Physical Exam  Constitutional: She appears well-developed and well-nourished.  HENT:  Head: Normocephalic and atraumatic.  Right Ear: External ear normal.  Left Ear: External ear normal.  Mouth/Throat: Oropharynx is clear and moist.  Eyes: Conjunctivae are normal. Pupils are equal, round, and reactive to light.  Neck: Normal range of motion. Neck supple.  Cardiovascular: Normal rate, regular rhythm and normal heart sounds.   Pulmonary/Chest: Effort normal and breath sounds normal.  Abdominal: Soft. Bowel sounds are normal.    ED Course  Procedures (including critical care time)  Labs Review Labs Reviewed - No data to display  Imaging Review No results found.   Visual Acuity Review  Right Eye Distance:   Left Eye Distance:   Bilateral Distance:    Right Eye Near:   Left Eye Near:    Bilateral Near:     CXR - No pneumonia Prelimnary reading by Angeline SlimWilliam Lonzy Mato,FNP    MDM  Influenza with bronchitis Tamiflu 75mg  one po bid x 5 days #10  Push po fluids, rest, tylenol and motrin otc prn as directed for fever, arthralgias, and myalgias.  Follow up prn if sx's continue or persist.    Deatra CanterWilliam J Yerlin Gasparyan, FNP 06/11/15 [phone]

## 2015-06-11 NOTE — ED Notes (Signed)
C/O cough, runny nose, HA, body aches, fevers x 3 days.  Has been taking IBU (last dose @ 0700 this AM) and OTC cough med.

## 2015-06-11 NOTE — Discharge Instructions (Signed)

## 2015-09-06 ENCOUNTER — Ambulatory Visit: Payer: Self-pay | Attending: Internal Medicine

## 2015-10-05 ENCOUNTER — Ambulatory Visit (INDEPENDENT_AMBULATORY_CARE_PROVIDER_SITE_OTHER): Payer: Self-pay | Admitting: Internal Medicine

## 2015-10-05 ENCOUNTER — Encounter: Payer: Self-pay | Admitting: Internal Medicine

## 2015-10-05 VITALS — BP 99/50 | HR 92 | Temp 97.5°F | Wt 126.0 lb

## 2015-10-05 DIAGNOSIS — L600 Ingrowing nail: Secondary | ICD-10-CM | POA: Insufficient documentation

## 2015-10-05 HISTORY — DX: Ingrowing nail: L60.0

## 2015-10-05 MED ORDER — SULFAMETHOXAZOLE-TRIMETHOPRIM 400-80 MG PO TABS: 1.0000 | ORAL_TABLET | Freq: Two times a day (BID) | ORAL | Status: DC

## 2015-10-05 NOTE — Progress Notes (Signed)
   [facility] [phone]  Subjective:  Toe Infection: She had a fungal infection of her feet 4 months ago. Treated with an anti-fungal. Last week, she noticed that her right great toe was painful. She tried to cut her nails last night and pus came out. She also noticed redness on her toe, but the redness has not spread. She is not able to wear shoes. She tried soaking her feet in hot water and epsom salt last night, which helped. No fevers, no chills, no nausea, no vomiting.  ROS: See HPI for pertinent positives and negatives Past Medical History- depression, hx paronychia  Reviewed problem list.  Medications- reviewed and updated Current Outpatient Prescriptions  Medication Sig Dispense Refill  . fluconazole (DIFLUCAN) 150 MG tablet Take 1 tablet (150 mg total) by mouth once. 1 tablet 0  . hydroxypropyl methylcellulose / hypromellose (ISOPTO TEARS / GONIOVISC) 2.5 % ophthalmic solution Place 1 drop into both eyes 3 (three) times daily. 15 mL 12  . oseltamivir (TAMIFLU) 75 MG capsule Take 1 capsule (75 mg total) by mouth 2 (two) times daily. 10 capsule 0   No current facility-administered medications for this visit.   Chief complaint-noted Family history reviewed for today's visit. No changes. Social history- patient is a never smoker  Objective: BP 99/50 mmHg  Pulse 92  Temp(Src) 97.5 F (36.4 C) (Oral)  Wt 126 lb (57.153 kg) Gen: NAD, alert, cooperative with exam HEENT: NCAT, EOMI, MMM Neck: FROM, supple Resp: Normal work of breathing Msk: Bilateral bunions, right great toe is erythematous and edematous, ingrown toenail present, unable to express any purulent material Neuro: Alert and oriented, no gross deficits Psych: Appropriate behavior  Assessment/Plan: Ingrown toenail with infection: Pt with hx of ingrown toenails. She was trying to cut her toenails last night and some pus came out. Her right great toe appears infected on exam. Unable to  express purulent material. - Will prescribe Bactrim x 10 days - Pt has been scheduled in Derm clinic for toenail removal - Return precautions given - Follow-up if worsening   Willadean CarolKaty Roxanne Panek, MD PGY-1

## 2015-10-05 NOTE — Assessment & Plan Note (Signed)
Pt with hx of ingrown toenails. She was trying to cut her toenails last night and some pus came out. Her right great toe appears infected on exam. Unable to express purulent material. - Will prescribe Bactrim x 10 days - Pt has been scheduled in Derm clinic for toenail removal - Return precautions given - Follow-up if worsening

## 2015-10-05 NOTE — Patient Instructions (Signed)
It was so nice to meet you!  I have sent in a prescription called Bactrim to help the infection. I would like for you to come to Dermatology clinic to have your toenail removed when the infection gets better.  If you notice spreading redness, please come back to see us.  -Dr. Nancy MarusMayo

## 2015-10-13 ENCOUNTER — Ambulatory Visit (INDEPENDENT_AMBULATORY_CARE_PROVIDER_SITE_OTHER): Payer: Self-pay | Admitting: Family Medicine

## 2015-10-13 ENCOUNTER — Encounter: Payer: Self-pay | Admitting: Family Medicine

## 2015-10-13 VITALS — BP 108/68 | HR 85 | Temp 98.3°F | Wt 124.0 lb

## 2015-10-13 DIAGNOSIS — L600 Ingrowing nail: Secondary | ICD-10-CM

## 2015-10-13 MED ORDER — TRAMADOL HCL 50 MG PO TABS: 50.0000 mg | ORAL_TABLET | Freq: Three times a day (TID) | ORAL | Status: DC | PRN

## 2015-10-13 NOTE — Patient Instructions (Signed)
Extracción de uñas de las manos o los pies, cuidados posteriores  (Fingernail or Toenail Removal, Care After)  Siga estas instrucciones durante las próximas semanas. Estas indicaciones le proporcionan información acerca de cómo deberá cuidarse después del procedimiento. El médico también podrá darle instrucciones más específicas. El tratamiento ha sido planificado según las prácticas médicas actuales, pero en algunos casos pueden ocurrir problemas. Comuníquese con el médico si tiene algún problema o tiene dudas después del procedimiento.  QUÉ ESPERAR DESPUÉS DEL PROCEDIMIENTO  Después del procedimiento, es común tener los siguientes síntomas:  · Enrojecimiento.  · Hinchazón.  INSTRUCCIONES PARA EL CUIDADO EN EL HOGAR  · Si tiene una férula en el dedo:    Úsela como se lo haya indicado el médico. Quítesela solamente como se lo haya indicado el médico.    Afloje la férula si los dedos se le entumecen, siente hormigueos o se le enfrían y se tornan de color azul.  · Si le indicaron que se ponga un zapato ortopédico, úselo como se lo haya indicado el médico.  · Tome los medicamentos solamente como se lo haya indicado el médico.  · Mantenga la mano o el pie elevado tanto tiempo como sea posible. Esto ayuda a aliviar el dolor y reducir la hinchazón.    Si se está recuperando de una extracción de uñas de la mano, mantenga la mano elevada por encima del nivel del corazón.    Si se está recuperando de una extracción de uñas del pie, acuéstese en una cama o un sillón con la pierna apoyada sobre almohadas, o bien siéntese en un sillón reclinable con el apoyapiés levantado.  · Siga las indicaciones del médico respecto de los cambios de los apósitos (vendajes) y cuándo pueden retirarse:    Cámbiese los vendajes 24 horas después del procedimiento o como se lo haya indicado el médico.    Remoje la mano o el pie en agua tibia jabonosa durante 10 a 20 minutos o como se lo haya indicado el médico. Hágalo 3 veces al día o como se lo  haya indicado el médico. Esto alivia el dolor y reduce la hinchazón.    Después de remojar la mano o el pie, colóquese un vendaje limpio y seco.    Mantenga el vendaje limpio y seco. Cámbielo cada vez que se moje o se ensucie.  · Concurra a todas las visitas de control como se lo haya indicado el médico. Esto es importante.  SOLICITE ATENCIÓN MÉDICA SI:  · Aumenta el enrojecimiento o el dolor en la zona de la uña.  · Observa un aumento de líquido, sangre o pus que emanan de la zona de la uña.  · Advierte un olor fétido que proviene del vendaje.  · Tiene fiebre.  · Aumenta la hinchazón o la hinchazón se extiende desde el dedo de la mano hasta la mano o desde el dedo del pie hasta el pie.  · Aumenta el enrojecimiento que se extiende desde el dedo de la mano hasta la mano o desde el dedo del pie hasta el pie.  · El dedo de la mano o del pie está azulado o negro.     Esta información no tiene como fin reemplazar el consejo del médico. Asegúrese de hacerle al médico cualquier pregunta que tenga.     Document Released: 08/02/2014  Elsevier Interactive Patient Education ©2016 Elsevier Inc.

## 2015-10-13 NOTE — Progress Notes (Signed)
   Subjective:   Audrey Jensen is a 40 y.o. female with a history of ingrown toenail and infection here for toenail follow-up with possible removal.  Patient was seen in clinic on 10/05/15 for an infected toenail. She is prescribed a 10 day course of Bactrim at that time. She reports that since that time, it has not improved.  She is no longer able to express purulent material.  She is taking Bactrim as prescribed.  She is wearing sandals, because her toe is too tender to wear shoes.  No fevers or spreading redness.  She has had previous removal of sides of great toenail, but never entire thing.  Review of Systems:  Per HPI.   Social History: never smoker  Objective:  BP 108/68 mmHg  Pulse 85  Temp(Src) 98.3 F (36.8 C) (Oral)  Wt 124 lb (56.246 kg)  Gen:  40 y.o. female in NAD, alert and cooperative HEENT: NCAT, MMM, anicteric sclerae Resp: Non-labored Ext: WWP, no edema MSK: Bilateral bunions, right great toe is erythematous and edematous, ingrown toenail present, TTP around entire nail folds, unable to express any purulent material Neuro: Alert and oriented, speech normal     Assessment & Plan:     Audrey Jensen is a 40 y.o. female here for toenail removal  Ingrowing toenail with infection R great toenail removed today No ablation of nailbed performed After care instructions given Finish course of Bactrim Tramadol prn pain control F/u prn     Patient given informed consent, signed copy in the chart. Appropriate time out taken.   Digital block done using 2% lidocaine without epinephrine, 10cc total.  Area prepped and draped in usual fashion. Entire nail elevated using nail elevator, removed using hemostats and traction force.   No complications.  Minimal bleeding.  Sterile bandage applied and post procedure instructions given.  Patient tolerated procedure well without complications.    Erasmo DownerAngela M Nakyia Dau, MD MPH PGY-3,  [facility] 10/13/2015  4:43 PM

## 2015-10-13 NOTE — Assessment & Plan Note (Signed)
R great toenail removed today No ablation of nailbed performed After care instructions given Finish course of Bactrim Tramadol prn pain control F/u prn

## 2015-12-08 ENCOUNTER — Ambulatory Visit (INDEPENDENT_AMBULATORY_CARE_PROVIDER_SITE_OTHER): Payer: Self-pay | Admitting: Internal Medicine

## 2015-12-08 VITALS — BP 110/64 | HR 83 | Temp 98.9°F | Wt 126.6 lb

## 2015-12-08 DIAGNOSIS — N926 Irregular menstruation, unspecified: Secondary | ICD-10-CM

## 2015-12-08 DIAGNOSIS — Z3481 Encounter for supervision of other normal pregnancy, first trimester: Secondary | ICD-10-CM

## 2015-12-08 LAB — POCT URINE PREGNANCY: PREG TEST UR: POSITIVE — AB

## 2015-12-08 MED ORDER — PRENATAL VITAMIN 27-0.8 MG PO TABS: 1.0000 | ORAL_TABLET | Freq: Every day | ORAL | 12 refills | Status: DC

## 2015-12-08 NOTE — Patient Instructions (Signed)
You will need to follow up for pregnancy labs. I will start you on prenatal. You need follow up for an initial prenatal.

## 2015-12-08 NOTE — Assessment & Plan Note (Addendum)
Positive pregnancy test - Follow up for OB labs and initial prenatal labs  - Start prenatal vitamins  - No ETOH, not currently taking any meds

## 2015-12-08 NOTE — Progress Notes (Signed)
   [facility] Noralee CharsAsiyah Mikell, MD Phone: [phone]  Reason For Visit: Preg test   # Last menstrual period on July 29th 2017. Has been using condoms for contraception. Pregnancy not planned. However, patient desires to keep pregnancy. No nausea or vomiting, sometimes feels fatigue. Overall feels pretty good    Past Medical History Reviewed problem list.  Medications- reviewed and updated No additions to family history Social history- patient is a non-smoker  Objective: BP 110/64 (BP Location: Left Arm, Patient Position: Sitting, Cuff Size: Normal)   Pulse 83   Temp 98.9 F (37.2 C) (Oral)   Wt 126 lb 9.6 oz (57.4 kg)   LMP 10/28/2015 (Exact Date)   SpO2 99%   BMI 22.43 kg/m  Gen: NAD, alert, cooperative with exam MSK: Normal gait and station Skin: dry, intact, no rashes or lesions   Assessment/Plan: See problem based a/p  Missed menses Positive pregnancy test - Follow up for OB labs and initial prenatal labs  - Start prenatal vitamins  - No ETOH, not currently taking any meds

## 2015-12-12 ENCOUNTER — Other Ambulatory Visit (INDEPENDENT_AMBULATORY_CARE_PROVIDER_SITE_OTHER): Payer: Self-pay

## 2015-12-12 ENCOUNTER — Other Ambulatory Visit: Payer: Self-pay

## 2015-12-12 DIAGNOSIS — Z3481 Encounter for supervision of other normal pregnancy, first trimester: Secondary | ICD-10-CM

## 2015-12-12 LAB — POCT URINALYSIS DIPSTICK
BILIRUBIN UA: NEGATIVE
Blood, UA: NEGATIVE
Glucose, UA: NEGATIVE
KETONES UA: NEGATIVE
Leukocytes, UA: NEGATIVE
NITRITE UA: NEGATIVE
PH UA: 6.5
Protein, UA: NEGATIVE
SPEC GRAV UA: 1.01
Urobilinogen, UA: 0.2

## 2015-12-13 ENCOUNTER — Ambulatory Visit (INDEPENDENT_AMBULATORY_CARE_PROVIDER_SITE_OTHER): Payer: Self-pay | Admitting: Family Medicine

## 2015-12-13 ENCOUNTER — Encounter: Payer: Self-pay | Admitting: Family Medicine

## 2015-12-13 VITALS — BP 104/65 | HR 79 | Temp 98.2°F | Wt 126.0 lb

## 2015-12-13 DIAGNOSIS — O209 Hemorrhage in early pregnancy, unspecified: Secondary | ICD-10-CM

## 2015-12-13 LAB — OBSTETRIC PANEL
ANTIBODY SCREEN: NEGATIVE
BASOS PCT: 0 %
Basophils Absolute: 0 cells/uL (ref 0–200)
EOS ABS: 66 {cells}/uL (ref 15–500)
Eosinophils Relative: 1 %
HCT: 38.3 % (ref 35.0–45.0)
Hemoglobin: 12.8 g/dL (ref 11.7–15.5)
Hepatitis B Surface Ag: NEGATIVE
LYMPHS PCT: 24 %
Lymphs Abs: 1584 cells/uL (ref 850–3900)
MCH: 28.3 pg (ref 27.0–33.0)
MCHC: 33.4 g/dL (ref 32.0–36.0)
MCV: 84.7 fL (ref 80.0–100.0)
MONO ABS: 462 {cells}/uL (ref 200–950)
MPV: 9.8 fL (ref 7.5–12.5)
Monocytes Relative: 7 %
Neutro Abs: 4488 cells/uL (ref 1500–7800)
Neutrophils Relative %: 68 %
PLATELETS: 215 10*3/uL (ref 140–400)
RBC: 4.52 MIL/uL (ref 3.80–5.10)
RDW: 13.7 % (ref 11.0–15.0)
RUBELLA: 9.86 {index} — AB (ref <0.90)
Rh Type: POSITIVE
WBC: 6.6 10*3/uL (ref 3.8–10.8)

## 2015-12-13 LAB — HCG, QUANTITATIVE, PREGNANCY: hCG, Beta Chain, Quant, S: 6315 m[IU]/mL — ABNORMAL HIGH (ref <5)

## 2015-12-13 LAB — HIV ANTIBODY (ROUTINE TESTING W REFLEX): HIV: NONREACTIVE

## 2015-12-13 NOTE — Patient Instructions (Signed)
I will contact you will the results of your labs.  If anything is abnormal, I will call you.  Otherwise, expect a copy to be mailed to you.  Vaginal Bleeding During Pregnancy, First Trimester A small amount of bleeding (spotting) from the vagina is relatively common in early pregnancy. It usually stops on its own. Various things may cause bleeding or spotting in early pregnancy. Some bleeding may be related to the pregnancy, and some may not. In most cases, the bleeding is normal and is not a problem. However, bleeding can also be a sign of something serious. Be sure to tell your health care provider about any vaginal bleeding right away. Some possible causes of vaginal bleeding during the first trimester include:  Infection or inflammation of the cervix.  Growths (polyps) on the cervix.  Miscarriage or threatened miscarriage.  Pregnancy tissue has developed outside of the uterus and in a fallopian tube (tubal pregnancy).  Tiny cysts have developed in the uterus instead of pregnancy tissue (molar pregnancy). HOME CARE INSTRUCTIONS  Watch your condition for any changes. The following actions may help to lessen any discomfort you are feeling:  Follow your health care provider's instructions for limiting your activity. If your health care provider orders bed rest, you may need to stay in bed and only get up to use the bathroom. However, your health care provider may allow you to continue light activity.  If needed, make plans for someone to help with your regular activities and responsibilities while you are on bed rest.  Keep track of the number of pads you use each day, how often you change pads, and how soaked (saturated) they are. Write this down.  Do not use tampons. Do not douche.  Do not have sexual intercourse or orgasms until approved by your health care provider.  If you pass any tissue from your vagina, save the tissue so you can show it to your health care provider.  Only take  over-the-counter or prescription medicines as directed by your health care provider.  Do not take aspirin because it can make you bleed.  Keep all follow-up appointments as directed by your health care provider. SEEK MEDICAL CARE IF:  You have any vaginal bleeding during any part of your pregnancy.  You have cramps or labor pains.  You have a fever, not controlled by medicine. SEEK IMMEDIATE MEDICAL CARE IF:   You have severe cramps in your back or belly (abdomen).  You pass large clots or tissue from your vagina.  Your bleeding increases.  You feel light-headed or weak, or you have fainting episodes.  You have chills.  You are leaking fluid or have a gush of fluid from your vagina.  You pass out while having a bowel movement. MAKE SURE YOU:  Understand these instructions.  Will watch your condition.  Will get help right away if you are not doing well or get worse.   This information is not intended to replace advice given to you by your health care provider. Make sure you discuss any questions you have with your health care provider.   Document Released: 12/26/2004 Document Revised: 03/23/2013 Document Reviewed: 11/23/2012 Elsevier Interactive Patient Education [employer]2016 Elsevier Inc.

## 2015-12-13 NOTE — Progress Notes (Signed)
   Subjective: CC: vaginal spotting during pregnancy QMV:[id number]:Audrey Jensen is a 40 y.o. female presenting to clinic today for same day appointment. PCP: Jamelle HaringHillary M Fitzgerald, MD Concerns today include:  1. Vaginal bleeding Patient reports that saw a few drops of brown mixed with scant red blood this morning in her underwear and when she wiped her vagina.  No abdominal pain/ cramping.  She denies dysuria, fevers, chills, nausea, vomiting.  She is sure that bleeding was coming from her vagina and not her rectum.  No recent sex.   FamHx and MedHx reviewed.  Please see EMR.  ROS: Per HPI  Objective: Office vital signs reviewed. BP 104/65   Pulse 79   Temp 98.2 F (36.8 C) (Oral)   Wt 126 lb (57.2 kg)   LMP 10/28/2015 (Exact Date)   SpO2 100%   BMI 22.32 kg/m   Physical Examination:  General: Awake, alert, well nourished, No acute distress Cardio: regular rate and rhythm, S1S2 heard, no murmurs appreciated Pulm: clear to auscultation bilaterally, no wheezes, rhonchi or rales, normal WOB on room air GU: external vaginal tissue normal, cervix appears closed but upon bimanual exam is a fingertip dilated, no punctate lesions on cervix appreciated, moderate brown discharge from cervical os, no cervical motion tenderness, no abdominal/ adnexal masses  Assessment/ Plan: 40 y.o. female   1. Bleeding in early pregnancy.  Clinically well appearing.  No pain or adnexal masses to suggest ectopic pregnancy but cannot rule this out.  Will obtain hCG and u/s.  Concern for imminent or incomplete abortion. - transvaginal/ pelvic OB ultrasound scheduled for 9/13 - hCG, quantitative, pregnancy - strict MAU precautions reviewed with patient - Discussed concern with patient, who voices good understanding - Will contact with results  Precepted with Drs Pollie MeyerMcIntyre and Hensel.   Raliegh IpAshly M Gottschalk, DO PGY-3, [facility]

## 2015-12-14 ENCOUNTER — Telehealth: Payer: Self-pay | Admitting: Family Medicine

## 2015-12-14 ENCOUNTER — Ambulatory Visit (HOSPITAL_COMMUNITY)
Admission: RE | Admit: 2015-12-14 | Discharge: 2015-12-14 | Disposition: A | Discharge status: Home / Self Care | Payer: Self-pay | Source: Ambulatory Visit | Attending: Family Medicine | Admitting: Family Medicine

## 2015-12-14 DIAGNOSIS — O4690 Antepartum hemorrhage, unspecified, unspecified trimester: Secondary | ICD-10-CM

## 2015-12-14 DIAGNOSIS — Z3A01 Less than 8 weeks gestation of pregnancy: Secondary | ICD-10-CM | POA: Insufficient documentation

## 2015-12-14 DIAGNOSIS — O208 Other hemorrhage in early pregnancy: Secondary | ICD-10-CM | POA: Insufficient documentation

## 2015-12-14 DIAGNOSIS — O209 Hemorrhage in early pregnancy, unspecified: Secondary | ICD-10-CM

## 2015-12-14 LAB — CULTURE, OB URINE

## 2015-12-14 NOTE — Telephone Encounter (Signed)
Discussed recent ultrasound and hCG result.  Ultrasound showed a single, live, intrauterine pregnancy w/ cardiac activity.  HCG is borderline.  If we are going by her LMP she would be 7160w5d and this would be an abnormal result.  However ultrasound dates her at 4961w5d, so hcg would be WNL.  Patient reports she has had no more vaginal spotting.  Denies cramping.  Will plan to repeat serum quant hCG tomorrow afternoon to ensure increasing levels.  Would consider further imaging of cervix to assess for funneling, as I thought her external os was about a fingertip dilated on yesterday' exam.  Though I was unable to assess internal cervical os.  Lab order placed.  Patient will come in tomorrow afternoon for blood test.  Will also cc patient's PCP/Ob provider for continued follow up.  Helina Hullum M. Nadine CountsGottschalk, DO PGY-3, [facility]

## 2015-12-15 ENCOUNTER — Other Ambulatory Visit: Payer: Self-pay

## 2015-12-15 DIAGNOSIS — O4690 Antepartum hemorrhage, unspecified, unspecified trimester: Secondary | ICD-10-CM

## 2015-12-16 LAB — HCG, QUANTITATIVE, PREGNANCY: HCG, BETA CHAIN, QUANT, S: 5692.8 m[IU]/mL — AB

## 2015-12-18 ENCOUNTER — Telehealth: Payer: Self-pay | Admitting: Family Medicine

## 2015-12-18 NOTE — Telephone Encounter (Signed)
I discussed last hCG with patient.  Unfortunately, I believe that miscarriage is inevitable.  The hCG is down-trending.  She notes that she had continued intermittent vaginal bleeding over th weekend and this morning.  I encouraged her to keep her appt for Wednesday with her PCP for repeat hCG and vaginal exam to inspect for complete passage of products of conception.  Patient acknowledges plan and voices appreciation for call.  Will cc: information to Dr Sampson GoonFitzgerald.  Promiss Labarbera M. Nadine CountsGottschalk, DO PGY-3, [facility]

## 2015-12-20 ENCOUNTER — Encounter: Payer: Self-pay | Admitting: Internal Medicine

## 2015-12-20 ENCOUNTER — Ambulatory Visit (INDEPENDENT_AMBULATORY_CARE_PROVIDER_SITE_OTHER): Payer: Self-pay | Admitting: Internal Medicine

## 2015-12-20 VITALS — BP 114/72 | HR 80 | Temp 97.9°F | Wt 125.0 lb

## 2015-12-20 DIAGNOSIS — O039 Complete or unspecified spontaneous abortion without complication: Secondary | ICD-10-CM

## 2015-12-20 HISTORY — DX: Complete or unspecified spontaneous abortion without complication: O03.9

## 2015-12-20 NOTE — Assessment & Plan Note (Addendum)
-   Suspect inevitable vs complete abortion based on speculum exam. Unclear if clot seen in vaginal vault was fetal tissue. Precepted with Dr. Pollie MeyerMcIntyre.  - Obtain repeat beta hCG quant - Scheduled transvaginal U/S for Friday, 9/22 (earliest), given viable pregnancy noted last week but will follow-up with patient by phone to determine if she would like to proceed with this once beta hCG results available - Spoke with on-call OB/GYN physician Dr. Alysia PennaErvin, who agreed with obtaining repeat beta hCG level. He said if levels have come down by a large degree could forgo repeat transvaginal U/S.  - Blood type is A positive, so no rhogam needed.

## 2015-12-20 NOTE — Progress Notes (Signed)
[facility] Progress Note  Subjective:  Audrey Jensen is a 40-y/o female who presents for continued bleeding in setting of early pregnancy, concerning for miscarriage.   Bleeding in pregnancy: - First evaluated for this 1 week ago, 12/13/15, which was the first day patient noted bleeding. - Denies abdominal cramping and reports "not a lot of bleeding" with only scant brown spots in her underwear and little light drops when she is urinating if she bears down - Transvaginal U/S confirmed a viable pregnancy 12/14/15 with cardiac activity and EGA of 5610w5d by CRL. However, beta hcg down-trending with level of 5,693 12/15/15 compared to 6,315 12/13/15.  - She is concerned that she has not bled very much and has not had cramping, as she has been reading up on miscarriage on the internet and saw that retained products can cause infection - Pregnancy was not planned, but she is very sad at loss of pregnancy. She says she is staying strong for her husband, who has taken this news harder and has a history of depression. She says she has support from her husband and a strong faith and could reach out to her pastor.  - She is [id number] and has 2 daughters, ages 306 and 2316.  - Denies excessive caffeine use (maybe 1 cup of coffee a day) - No recent sexual activity - Did have urine culture positive for E. coli UTI, that resulted 12/14/15 ROS: No fever, no n/v/d, no abdominal pain, no dysuria, no increased vaginal discharge  No Known Allergies  Objective: Blood pressure 114/72, pulse 80, temperature 97.9 F (36.6 C), temperature source Oral, weight 125 lb (56.7 kg), last menstrual period 10/28/2015, SpO2 100 %. Constitutional: Well-appearing female in NAD Abdominal: Soft. +BS, NT, ND, no rebound or guarding.  GU: Pooling dark blood in vaginal vault on speculum exam. Cervix somewhat dilated with bleeding present. Small blood clot present in vault but no obvious tissue.  Psychiatric:  Occasionally tearful.  Vitals reviewed  Assessment/Plan: Spontaneous abortion - Suspect inevitable vs complete abortion based on speculum exam. Unclear if clot seen in vaginal vault was fetal tissue. Precepted with Dr. Pollie MeyerMcIntyre.  - Obtain repeat beta hCG quant - Scheduled transvaginal U/S for Friday, 9/22 (earliest), given viable pregnancy noted last week but will follow-up with patient by phone to determine if she would like to proceed with this once beta hCG results available - Spoke with on-call OB/GYN physician Dr. Alysia PennaErvin, who agreed with obtaining repeat beta hCG level. He said if levels have come down by a large degree could forgo repeat transvaginal U/S.  - Blood type is A positive, so no rhogam needed.  Follow-up depends on hCG level and patient preference. Patient indicated interest in discussing birth control at future appointment.  Dani GobbleHillary Fitzgerald, MD [facility], PGY-2

## 2015-12-20 NOTE — Patient Instructions (Signed)
Audrey Jensen,  I will call you with the blood test results. They may not be available until late tomorrow.  Take ibuprofen if you develop cramping.  I will call you also to speak about ultrasound results.  Best, Dr. Sampson GoonFitzgerald

## 2015-12-21 ENCOUNTER — Telehealth: Payer: Self-pay | Admitting: Internal Medicine

## 2015-12-21 DIAGNOSIS — O039 Complete or unspecified spontaneous abortion without complication: Secondary | ICD-10-CM

## 2015-12-21 LAB — HCG, QUANTITATIVE, PREGNANCY: HCG, BETA CHAIN, QUANT, S: 6227.3 m[IU]/mL — AB

## 2015-12-21 NOTE — Telephone Encounter (Signed)
Spoke with patient about beta hcg result. Level is staying in the 6000s. Per conversation with Dr. Alysia PennaErvin yesterday, ultrasound not necessary if levels coming down. Patient says she began bleeding more and passed some small clots and something that resembled a deflated balloon after noon following yesterday's appointment. She also had some abdominal cramping. Given this, I advised patient she likely did not need the U/S to confirm loss of pregnancy, but she would like to proceed with U/S tomorrow to make sure "everything is out." Recommended following up next week for beta hcg quant. Placed future order and asked patient to call for lab appointment.

## 2015-12-22 ENCOUNTER — Ambulatory Visit (HOSPITAL_COMMUNITY)
Admission: RE | Admit: 2015-12-22 | Discharge: 2015-12-22 | Disposition: A | Discharge status: Home / Self Care | Payer: Self-pay | Source: Ambulatory Visit | Attending: Family Medicine | Admitting: Family Medicine

## 2015-12-22 DIAGNOSIS — O039 Complete or unspecified spontaneous abortion without complication: Secondary | ICD-10-CM | POA: Insufficient documentation

## 2015-12-23 NOTE — Progress Notes (Signed)
Opened in Error. Not initial prenatal visit. Encounter for miscarriage.

## 2015-12-25 ENCOUNTER — Other Ambulatory Visit: Payer: Self-pay | Admitting: Internal Medicine

## 2015-12-25 DIAGNOSIS — N390 Urinary tract infection, site not specified: Secondary | ICD-10-CM

## 2015-12-25 MED ORDER — CEPHALEXIN 250 MG PO CAPS: 250.0000 mg | ORAL_CAPSULE | Freq: Four times a day (QID) | ORAL | 0 refills | Status: AC

## 2015-12-27 ENCOUNTER — Other Ambulatory Visit: Payer: Self-pay

## 2015-12-27 DIAGNOSIS — O039 Complete or unspecified spontaneous abortion without complication: Secondary | ICD-10-CM

## 2015-12-28 LAB — HCG, QUANTITATIVE, PREGNANCY: hCG, Beta Chain, Quant, S: 28.7 m[IU]/mL — ABNORMAL HIGH

## 2015-12-29 ENCOUNTER — Other Ambulatory Visit: Payer: Self-pay | Admitting: Internal Medicine

## 2015-12-29 ENCOUNTER — Encounter: Payer: Self-pay | Admitting: Internal Medicine

## 2015-12-29 NOTE — Telephone Encounter (Signed)
Discussed results of recent ultrasound report that was consistent with completed abortion. Also asked patient about any UTI symptoms, as initial prenatal urine culture grew bactrim-resistant E coli. Patient did report sensation of pressure with urination. Placed order for keflex.

## 2016-09-20 ENCOUNTER — Ambulatory Visit: Payer: Self-pay | Attending: Internal Medicine

## 2016-11-15 ENCOUNTER — Encounter: Payer: Self-pay | Admitting: Internal Medicine

## 2016-11-15 ENCOUNTER — Ambulatory Visit (INDEPENDENT_AMBULATORY_CARE_PROVIDER_SITE_OTHER): Payer: Self-pay | Admitting: Internal Medicine

## 2016-11-15 VITALS — BP 114/62 | HR 76 | Temp 99.0°F | Ht 63.0 in | Wt 135.4 lb

## 2016-11-15 DIAGNOSIS — F439 Reaction to severe stress, unspecified: Secondary | ICD-10-CM

## 2016-11-15 DIAGNOSIS — E781 Pure hyperglyceridemia: Secondary | ICD-10-CM

## 2016-11-15 DIAGNOSIS — Z0000 Encounter for general adult medical examination without abnormal findings: Secondary | ICD-10-CM

## 2016-11-15 DIAGNOSIS — H524 Presbyopia: Secondary | ICD-10-CM

## 2016-11-15 DIAGNOSIS — R739 Hyperglycemia, unspecified: Secondary | ICD-10-CM

## 2016-11-15 LAB — POCT GLYCOSYLATED HEMOGLOBIN (HGB A1C): HEMOGLOBIN A1C: 5.6

## 2016-11-15 NOTE — Patient Instructions (Addendum)
Ms. Baneen Dua,  I recommend trying to get some regular exercise to help with stress and to keep your heart healthy.  I will send a letter with your lab results.  Please see [place] back if your hand pain gets worse or if your stress levels increase. [employer] also offers counseling services if you are interested.  You should get a call about an eye doctor appointment within the next week.   Best, Dr. Sampson Goon

## 2016-11-16 DIAGNOSIS — Z0000 Encounter for general adult medical examination without abnormal findings: Secondary | ICD-10-CM | POA: Insufficient documentation

## 2016-11-16 DIAGNOSIS — H524 Presbyopia: Secondary | ICD-10-CM | POA: Insufficient documentation

## 2016-11-16 DIAGNOSIS — F439 Reaction to severe stress, unspecified: Secondary | ICD-10-CM | POA: Insufficient documentation

## 2016-11-16 NOTE — Progress Notes (Signed)
[facility] Progress Note  Subjective:  Audrey Jensen is a 41 y.o. female here for check-up.  Concerns:   -Has been worried about her daughter who has been losing weight and restricting her diet. However, says they have resources and a plan in place and starting to see improvements. She also has stress from family members back in [place] who ask for money. Says she has trouble saying no and is always offering to help others. Current political climate and attitudes towards immigrants is also a stressor.  -Notes trouble reading up close and says she has never seen an eye doctor.    Gyn concerns/Preventative healthcare  Last menstrual period: Patient's last menstrual period was 10/22/2016.  Regular periods: yes; there was a period of time last fall after miscarriage with some irregularity but has since resolved  Heavy bleeding: no  Sexually active: yes  Contraception or hormonal therapy: uses condoms; not interested in hormonal methods as says she does not have sex that frequently and had scare with IUD in past when doctor could not find the strings (since removed)  Hx of STD: Patient does not desire STD screening  Dyspareunia: No  Hot flashes: No  Vaginal discharge: No  Dysuria:No   Last mammogram: N/A  Breast mass or concerns: No  Last pap smear: 03/29/13 (normal and no high-risk HPV); due 03/29/18  PMHx: Bell's Palsy, depression  No Known Allergies   Social: - Does not exercise regularly - Never smoker - Has EtOH monthly or less and has only 1-2 drinks at a time - Does not use drugs - Feels safe in her relationship  PHQ-2: 0  Complete ROS performed and was only positive for stress.    Objective: Blood pressure 114/62, pulse 76, temperature 99 F (37.2 C), temperature source Oral, height 5\' 3"  (1.6 m), weight 135 lb 6.4 oz (61.4 kg), last menstrual period 10/22/2016, SpO2 98 %. Body mass index is 23.99 kg/m. Constitutional:  Well-appearing female in NAD HENT: MMM, posterior oropharynx normal Cardiovascular: RRR, S1, S2, no m/r/g.  Pulmonary/Chest: Effort normal and breath sounds normal. No respiratory distress.  Abdominal: Soft. +BS, NT, ND Musculoskeletal: No LE edema Neurological: AOx3, no focal deficits. Skin: Skin is warm and dry. No rash noted. No erythema.  Psychiatric: Becomes tearful when talking about her family.  Vitals reviewed  Last lipid panel 03/2013 CMP in 2013 with mildly elevated glucose at 103.   Assessment/Plan: Stress - Patient says she can confide in her husband. Provided resources for counseling and also let her know she could always make an appointment with [facility], as well. PHQ-9 of 0. - Recommended regular exercise to help with stress and to ensure she is making some time to focus on her own wellbeing - Patient to try to start saying "no" when asked for favors when she feels overwhelmed  Healthcare maintenance - Will obtain lipid panel, as it has been over 3 years.  - Will obtain hgb A1c, as never screened.  - Pap smear due next year.   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  Dani Gobble, MD [facility], PGY-3

## 2016-11-16 NOTE — Assessment & Plan Note (Signed)
-   Placed referral to Ophthalmology per patient request

## 2016-11-16 NOTE — Assessment & Plan Note (Signed)
-   Will obtain lipid panel, as it has been over 3 years.  - Will obtain hgb A1c, as never screened.  - Pap smear due next year.

## 2016-11-16 NOTE — Assessment & Plan Note (Signed)
-   Patient says she can confide in her husband. Provided resources for counseling and also let her know she could always make an appointment with [facility] at [facility], as well. PHQ-9 of 0. - Recommended regular exercise to help with stress and to ensure she is making some time to focus on her own wellbeing - Patient to try to start saying "no" when asked for favors when she feels overwhelmed

## 2016-11-18 LAB — LIPID PANEL
Chol/HDL Ratio: 3.9 ratio (ref 0.0–4.4)
Cholesterol, Total: 155 mg/dL (ref 100–199)
HDL: 40 mg/dL (ref >39)
LDL Calculated: 53 mg/dL (ref 0–99)
TRIGLYCERIDES: 308 mg/dL — AB (ref 0–149)
VLDL Cholesterol Cal: 62 mg/dL — ABNORMAL HIGH (ref 5–40)

## 2016-11-19 ENCOUNTER — Encounter: Payer: Self-pay | Admitting: Internal Medicine

## 2017-03-26 ENCOUNTER — Ambulatory Visit: Payer: Self-pay

## 2017-04-02 ENCOUNTER — Ambulatory Visit: Payer: Self-pay

## 2017-06-16 ENCOUNTER — Ambulatory Visit (INDEPENDENT_AMBULATORY_CARE_PROVIDER_SITE_OTHER): Payer: Self-pay | Admitting: Student

## 2017-06-16 ENCOUNTER — Other Ambulatory Visit: Payer: Self-pay

## 2017-06-16 ENCOUNTER — Encounter: Payer: Self-pay | Admitting: Student

## 2017-06-16 VITALS — BP 108/68 | HR 121 | Temp 100.8°F | Ht 63.0 in | Wt 134.6 lb

## 2017-06-16 DIAGNOSIS — R0981 Nasal congestion: Secondary | ICD-10-CM

## 2017-06-16 DIAGNOSIS — J309 Allergic rhinitis, unspecified: Secondary | ICD-10-CM

## 2017-06-16 DIAGNOSIS — B9789 Other viral agents as the cause of diseases classified elsewhere: Secondary | ICD-10-CM

## 2017-06-16 DIAGNOSIS — J069 Acute upper respiratory infection, unspecified: Secondary | ICD-10-CM

## 2017-06-16 MED ORDER — FLUTICASONE PROPIONATE 50 MCG/ACT NA SUSP: 2.0000 | Freq: Every day | NASAL | 6 refills | Status: DC

## 2017-06-16 MED ORDER — PSEUDOEPHEDRINE HCL ER 120 MG PO TB12
120.0000 mg | ORAL_TABLET | Freq: Two times a day (BID) | ORAL | 0 refills | Status: DC
Start: 2017-06-16 — End: 2017-06-16

## 2017-06-16 NOTE — Progress Notes (Signed)
  Subjective:    Audrey Jensen is a 42 y.o. old female here for cough, congestion and fever.  HPI Had common cold for two week. Symptoms resolved except for cough. Then, she started with another round of congestion, runny nose, sore throat, fever to 102, chills, chest pain, shortness of breath after she went of his daughter's college orientation two days ago. Denies myalgia. Denies nausea, emesis, abdominal pain and diarrhea. Cough is productive with yellowish phlegm. Small blood tinge. Describes the chest pain as burning when she breaths. No radiation.  She has not had flu vaccine this season.  No history of asthma.  Reports history of seasonal allergy.  LMP two weeks. Not sexually active since LMP.   PMH/Problem List: has MAJOR DEPRESSIVE DISORDER RECURRENT EPISODE MILD; Ulcer aphthous oral; Skin lesion; Bell's palsy; Vaginal discharge; Well woman exam with routine gynecological exam; Ingrowing toenail with infection; Missed menses; Spontaneous abortion; Stress; Healthcare maintenance; and Presbyopia on their problem list.   has no past medical history on file.  FH:  No family history on file.  SH Social History   Tobacco Use  . Smoking status: Never Smoker  . Smokeless tobacco: Never Used  Substance Use Topics  . Alcohol use: No  . Drug use: No    Review of Systems Review of systems negative except for pertinent positives and negatives in history of present illness above.     Objective:     Vitals:   06/16/17 1347  BP: 108/68  Pulse: (!) 121  Temp: (!) 100.8 F (38.2 C)  TempSrc: Oral  SpO2: 97%  Weight: 134 lb 9.6 oz (61.1 kg)  Height: 5\' 3"  (1.6 m)   Body mass index is 23.84 kg/m.  Physical Exam  GEN: appears well, nasal, intermittent cough. Head: normocephalic and atraumatic  Eyes: conjunctiva without injection, sclera anicteric Nares: Significant rhinorrhea and congestion.  Swollen inferior turbinate on the right Oropharynx: mmm without erythema, exudation or  petechiae.  Uvula midline HEM: negative for cervical or periauricular lymphadenopathies CVS: RRR, nl s1 & s2, no murmurs, no edema,  2+ DP pulses bilaterally, cap refills brisk RESP: no IWOB, good air movement bilaterally, CTAB GI: BS present & normal, soft, NTND MSK: no focal tenderness or notable swelling SKIN: no apparent skin lesion NEURO: alert and oiented appropriately, no gross deficits  PSYCH: euthymic mood with congruent affect    Assessment and Plan:  1. Viral URI with cough: history and exam suggestive for viral URTI.  She is tachycardic likely due to fever.  Low suspicion for strep pharyngitis or pneumonia.  Lung exam within normal limits.  She has no respiratory distress.  I think she has viral illness after viral illness.  Recommended trying Afrin for 3 days for nasal congestion.  Gave prescription for Flonase for possible underlying allergic rhinitis. -Recommended conservative management including rest, a tablespoonful of honey and adequate hydration -Discussed return precautions including but not limited to shortness of breath or increased working of breathing, severe persistent cough, persistent fever over 101F, not tolerating fluids by mouth or other symptoms concerning to her   2. Allergic rhinitis, unspecified seasonality, unspecified trigger - fluticasone (FLONASE) 50 MCG/ACT nasal spray; Place 2 sprays into both nostrils daily.  Dispense: 16 g; Refill: 6  Return if symptoms worsen or fail to improve.  Almon Herculesaye T Kevia Zaucha, MD 06/16/17 Pager: [phone]

## 2017-06-16 NOTE — Patient Instructions (Signed)
It appears that you have a viral upper respiratory infection (Common Cold).  Cold symptoms typically peak at 3-4 days of illness and then gradually improve over 10-14 days. However, a cough may last 3-5 weeks.   - You can get Afrin nose spray and use it 3-4 times a day for nasal congestion.  Please do not use it more than 3 days! - Use Flonase nasal spray as as discussed in the office. - A tablespoonful of honey before bedtime is helpful for cough - Get plenty of rest and adequate hydration. - Consume warm fluids (soup or tea). It relieves stuffy nose, and to loosen phlegm. - Can try saline nasal spray or a Neti Pot for stuffy nose  CONTACT YOUR DOCTOR IF YOU EXPERIENCE ANY OF THE FOLLOWING: - High fever, chest pain, shortness of breath or  not able to keep down food or fluids.  - Cough that gets worse while other cold symptoms improve - Flare up of any chronic lung problem, such as asthma - Your symptoms persist longer than 2 weeks

## 2017-10-15 ENCOUNTER — Ambulatory Visit: Payer: Self-pay

## 2018-03-17 ENCOUNTER — Other Ambulatory Visit (HOSPITAL_COMMUNITY): Payer: Self-pay | Admitting: *Deleted

## 2018-03-17 DIAGNOSIS — Z1231 Encounter for screening mammogram for malignant neoplasm of breast: Secondary | ICD-10-CM

## 2018-06-04 ENCOUNTER — Ambulatory Visit
Admission: RE | Admit: 2018-06-04 | Discharge: 2018-06-04 | Disposition: A | Discharge status: Home / Self Care | Payer: Self-pay | Source: Ambulatory Visit | Attending: Obstetrics and Gynecology | Admitting: Obstetrics and Gynecology

## 2018-06-04 ENCOUNTER — Ambulatory Visit (HOSPITAL_COMMUNITY): Payer: Self-pay

## 2018-06-04 DIAGNOSIS — Z1231 Encounter for screening mammogram for malignant neoplasm of breast: Secondary | ICD-10-CM

## 2018-06-08 ENCOUNTER — Other Ambulatory Visit (HOSPITAL_COMMUNITY): Payer: Self-pay | Admitting: *Deleted

## 2018-06-08 ENCOUNTER — Other Ambulatory Visit: Payer: Self-pay | Admitting: Obstetrics and Gynecology

## 2018-06-08 DIAGNOSIS — R928 Other abnormal and inconclusive findings on diagnostic imaging of breast: Secondary | ICD-10-CM

## 2018-06-18 ENCOUNTER — Ambulatory Visit: Payer: PRIVATE HEALTH INSURANCE

## 2018-06-18 ENCOUNTER — Encounter (HOSPITAL_COMMUNITY): Payer: Self-pay

## 2018-06-18 ENCOUNTER — Ambulatory Visit
Admission: RE | Admit: 2018-06-18 | Discharge: 2018-06-18 | Disposition: A | Discharge status: Home / Self Care | Payer: No Typology Code available for payment source | Source: Ambulatory Visit | Attending: Obstetrics and Gynecology | Admitting: Obstetrics and Gynecology

## 2018-06-18 ENCOUNTER — Other Ambulatory Visit: Payer: Self-pay

## 2018-06-18 ENCOUNTER — Ambulatory Visit (HOSPITAL_COMMUNITY)
Admission: RE | Admit: 2018-06-18 | Discharge: 2018-06-18 | Disposition: A | Discharge status: Home / Self Care | Payer: PRIVATE HEALTH INSURANCE | Source: Ambulatory Visit | Attending: Obstetrics and Gynecology | Admitting: Obstetrics and Gynecology

## 2018-06-18 VITALS — BP 110/72 | Temp 98.1°F | Wt 134.0 lb

## 2018-06-18 DIAGNOSIS — R928 Other abnormal and inconclusive findings on diagnostic imaging of breast: Secondary | ICD-10-CM

## 2018-06-18 DIAGNOSIS — Z1239 Encounter for other screening for malignant neoplasm of breast: Secondary | ICD-10-CM

## 2018-06-18 NOTE — Patient Instructions (Signed)
Explained breast self awareness with [name]. Patient did not need a Pap smear today due to last Pap smear was in 2018 per patient. Let her know [facility]CP will cover Pap smears every 3 years unless has a history of abnormal Pap smears. Referred patient to the [facility] for a left breast diagnostic mammogram and possible ultrasound per recommendation. Appointment scheduled for Thursday, June 18, 2018 at 0940. Patient aware of appointment and will be there. Alroy Bailiff Derrek Gu verbalized understanding.  Brecken Dewoody, Kathaleen Maser, RN 9:23 AM

## 2018-06-18 NOTE — Progress Notes (Signed)
Patient referred to [facility] by the [facility] due to recommending additional imaging of the left breast. Screening mammogram completed 06/04/2018.  Pap Smear: Pap smear not completed today. Last Pap smear was in 2018 at the [facility] Department and normal per patient. Per patient has no history of an abnormal Pap smear. No Pap smear results are in Epic.  Physical exam: Breasts Breasts symmetrical. No skin abnormalities bilateral breasts. No nipple retraction bilateral breasts. No nipple discharge bilateral breasts. No lymphadenopathy. No lumps palpated bilateral breasts. No complaints of pain or tenderness on exam. Referred patient to the [facility] for a left breast diagnostic mammogram and possible ultrasound per recommendation. Appointment scheduled for Thursday, June 18, 2018 at 0940.        Pelvic/Bimanual No Pap smear completed today since last Pap smear was in 2018 per patient. Pap smear not indicated per BCCCP guidelines.   Smoking History: Patient has never smoked.  Patient Navigation: Patient education provided. Access to services provided for patient through [facility] program. Spanish interpreter provided.   Breast and Cervical Cancer Risk Assessment: Patient has no family history of breast cancer, known genetic mutations, or radiation treatment to the chest before age 23. Patient has no history of cervical dysplasia, immunocompromised, or DES exposure in-utero.  Risk Assessment    Risk Scores      06/18/2018   Last edited by: Lynnell Dike, LPN   5-year risk: 0.4 %   Lifetime risk: 5.7 %         Used Spanish interpreter [name] from [place].

## 2018-07-15 ENCOUNTER — Encounter (HOSPITAL_COMMUNITY): Payer: Self-pay | Admitting: *Deleted

## 2018-10-05 NOTE — Progress Notes (Signed)
[place] Interpretor [phone] used during the encounter  I connected with  Audrey Jensen on 10/06/18 by a video enabled telemedicine application and verified that I am speaking with the correct person using two identifiers.   I discussed the limitations of evaluation and management by telemedicine. The patient expressed understanding and agreed to proceed.  Patient consented to have virtual visit. Method of visit: Telephone  Encounter participants: Patient: Audrey Jensen - located at Home Provider: Caroline More - located at [facility] Others (if applicable): Interpretor   Chief Complaint: headaches   HPI: HEADACHE  Onset: x1 month   Location: right side of head  Quality: feels like a migraine, photophobia. Heaviness sensation. No watery eyes when this happens. Reports veins burst in her eyes. Cleared up but came right back. Headaches on same side of her head. All on right side.  Frequency: 3-4x/week   Precipitating factors: none  Prior treatment: no Pain can come in morning or evening  Associated Symptoms Nausea/vomiting: no  Photophobia/phonophobia: yes  Tearing of eyes: no  Sinus pain/pressure: no  Family hx migraine: yes, sister had migraines and h/o stroke  Personal stressors: yes, 2/2 COVID pandemic. Husband has been out of work   Relation to menstrual cycle: no   Red Flags Fever: no  Neck pain/stiffness: no Diplopia.yes, blurry with near vision   Temple pain/tenderness. no   Jaw Claudication. no   Shoulder pain/stiffness.no   Vision/speech/swallow/hearing difficulty: no  Focal weakness/numbness: no  Altered mental status: no  Trauma: no  New type of headache: no  Anticoagulant use: no  H/o cancer/HIV/Pregnancy: no   ROS: per HPI  Pertinent PMHx: presbypopia, stress   Exam:  Respiratory: No increased work of breathing, speaking full sentences, no apparent  distress  Assessment/Plan:  Tension headache Symptoms consistent with tension headache.  Less likely temporal arteritis given age.  Less likely cluster headaches as no rhinorrhea or watery eyes as well as female gender.  No focal neurological deficits which is reassuring.  No red flags.  Advised that patient follow-up in 1 week if no improvement.  Advised that she follow-up sooner if eye pain develops or headaches worsen or change in character.  Patient can use Tylenol or ibuprofen as needed.  Strict return precautions given.  Presbyopia Advised that patient follow-up with optometry    Time spent during visit with patient: 26 minutes

## 2018-10-06 ENCOUNTER — Other Ambulatory Visit: Payer: Self-pay

## 2018-10-06 ENCOUNTER — Telehealth (INDEPENDENT_AMBULATORY_CARE_PROVIDER_SITE_OTHER): Payer: Self-pay | Admitting: Family Medicine

## 2018-10-06 DIAGNOSIS — G44209 Tension-type headache, unspecified, not intractable: Secondary | ICD-10-CM

## 2018-10-06 DIAGNOSIS — H524 Presbyopia: Secondary | ICD-10-CM

## 2018-10-06 HISTORY — DX: Tension-type headache, unspecified, not intractable: G44.209

## 2018-10-06 NOTE — Assessment & Plan Note (Signed)
Advised that patient follow-up with optometry

## 2018-10-06 NOTE — Assessment & Plan Note (Addendum)
Symptoms consistent with tension headache.  Less likely temporal arteritis given age.  Less likely cluster headaches as no rhinorrhea or watery eyes as well as female gender.  No focal neurological deficits which is reassuring.  No red flags.  Advised that patient follow-up in 1 week if no improvement.  Advised that she follow-up sooner if eye pain develops or headaches worsen or change in character.  Patient can use Tylenol or ibuprofen as needed.  Strict return precautions given.

## 2018-10-26 ENCOUNTER — Other Ambulatory Visit: Payer: Self-pay

## 2018-10-26 DIAGNOSIS — Z20822 Contact with and (suspected) exposure to covid-19: Secondary | ICD-10-CM

## 2018-10-27 LAB — NOVEL CORONAVIRUS, NAA: SARS-CoV-2, NAA: DETECTED — AB

## 2018-10-28 ENCOUNTER — Telehealth: Payer: Self-pay | Admitting: Family Medicine

## 2018-10-28 NOTE — Telephone Encounter (Signed)
Nurse Opal Sidles from Patient Audrey Jensen called to inform that pt has tested positive for Coronavirus, pt also needs to be informed. Please give nurse a call back at [phone].

## 2018-10-29 ENCOUNTER — Telehealth: Payer: Self-pay | Admitting: Family Medicine

## 2018-10-29 NOTE — Telephone Encounter (Signed)
Call patient to inform her of COVID positive result, collected on 7/27.  States she initially got the test due to one of her coworkers testing positive and she started feeling fatigued on Saturday, 7/25. Since that time she has developed a lack of smell and appetite and some mild diarrhea.  She has been trying to keep herself well-hydrated.  Denies any fever, coughing, difficulty breathing, or chest pains.  Lives with her husband and daughter, both of which tested positive for COVID yesterday.  They have been quarantined at home since Saturday staying in separate rooms.   Discussed supportive measures including staying well-hydrated with water/Gatorade/diluted apple juice, steam showers/baths, Tylenol 650 q. 4-6 as needed, and plenty of rest. Patient instructed to present to the ED if experiencing any SOB or difficulty breathing.  Additionally instructed to call the clinic at any time if any further questions.  Recommended minimum of 10-day quarantine since symptom onset, with an additional at least 24 hours of no fever without antipyretics and improved symptoms per CDC guidelines.  Offered a work note, states she does not need one at this time as her work is aware and understanding.  Will follow-up with patient in approximately 1 week to check on status, or sooner if needed for above.  Patriciaann Clan, DO

## 2018-10-30 NOTE — Telephone Encounter (Signed)
Call patient back on 7/30 and notified of results. Thank you!

## 2018-11-11 ENCOUNTER — Other Ambulatory Visit: Payer: Self-pay

## 2018-11-11 DIAGNOSIS — Z20822 Contact with and (suspected) exposure to covid-19: Secondary | ICD-10-CM

## 2018-11-12 LAB — NOVEL CORONAVIRUS, NAA: SARS-CoV-2, NAA: DETECTED — AB

## 2018-11-20 ENCOUNTER — Other Ambulatory Visit: Payer: Self-pay | Admitting: *Deleted

## 2018-11-20 DIAGNOSIS — Z20822 Contact with and (suspected) exposure to covid-19: Secondary | ICD-10-CM

## 2018-11-21 LAB — NOVEL CORONAVIRUS, NAA: SARS-CoV-2, NAA: NOT DETECTED

## 2019-01-07 ENCOUNTER — Other Ambulatory Visit (HOSPITAL_COMMUNITY)
Admission: RE | Admit: 2019-01-07 | Discharge: 2019-01-07 | Disposition: A | Discharge status: Home / Self Care | Payer: No Typology Code available for payment source | Source: Ambulatory Visit | Attending: Family Medicine | Admitting: Family Medicine

## 2019-01-07 ENCOUNTER — Ambulatory Visit (INDEPENDENT_AMBULATORY_CARE_PROVIDER_SITE_OTHER): Payer: Self-pay | Admitting: Family Medicine

## 2019-01-07 ENCOUNTER — Other Ambulatory Visit: Payer: Self-pay

## 2019-01-07 VITALS — BP 96/58 | HR 72 | Wt 124.2 lb

## 2019-01-07 DIAGNOSIS — Z23 Encounter for immunization: Secondary | ICD-10-CM

## 2019-01-07 DIAGNOSIS — Z124 Encounter for screening for malignant neoplasm of cervix: Secondary | ICD-10-CM

## 2019-01-07 DIAGNOSIS — N9089 Other specified noninflammatory disorders of vulva and perineum: Secondary | ICD-10-CM

## 2019-01-07 LAB — POCT WET PREP (WET MOUNT)
Clue Cells Wet Prep Whiff POC: NEGATIVE
Trichomonas Wet Prep HPF POC: ABSENT
WBC, Wet Prep HPF POC: >20

## 2019-01-07 NOTE — Patient Instructions (Addendum)
It was nice meeting you today [name]!  Today, we tested you for yeast, bacterial vaginosis, trichomonas, gonorrhea, chlamydia, and performed a Pap smear.  Your wet prep did not show any yeast or bad bacteria.  For your itching, you can buy an over-the-counter feminine anti-itch cream which should hopefully help your symptoms.  We will let you know about the rest of these results when they return and send you any necessary treatment.  We hope that you feel better soon.  If you have any questions or concerns, please feel free to call the clinic.   Be well,  Dr. Shan Levans

## 2019-01-07 NOTE — Assessment & Plan Note (Addendum)
Wet prep negative for bacterial vaginosis, trichomonas, and yeast.  Although patient's irritation could be consistent with a yeast infection, she does not have any discharge on exam that appears consistent with this diagnosis.  Advised patient to use over-the-counter anti-itch vaginal creams and Replens vaginal moisturizer, and images were given to help guide her with this.  Also advised patient that we can retest her if her symptoms do not improve with time.  We will also follow-up her GC chlamydia result.

## 2019-01-07 NOTE — Assessment & Plan Note (Signed)
Pap smear performed today. 

## 2019-01-07 NOTE — Progress Notes (Signed)
   Subjective:    Audrey Jensen - 43 y.o. female MRN [id number]  Date of birth: 07/11/1975  CC:  Audrey Jensen is here for vulvar itching.  She is also due for a pap smear.  HPI: Endorses vulvar itching, dysuria that she attributes to vulvar irritation Similar symptoms in the past when she had a yeast infection Has been drinking more water so is urinating more frequently, but no concerning frequency or urgency Normal, regular periods, no pelvic pain Monogamous with husband, but would like GC/Chlamydia testing just in case Is not worried that she could be pregnant because she has not had sex recently   Health Maintenance:  Health Maintenance Due  Topic Date Due  . PAP SMEAR-Modifier  03/29/2018  . INFLUENZA VACCINE  10/31/2018    -  reports that she has never smoked. She has never used smokeless tobacco. - Review of Systems: Per HPI. - Past Medical History: Patient Active Problem List   Diagnosis Date Noted  . Tension headache 10/06/2018  . Stress 11/16/2016  . Healthcare maintenance 11/16/2016  . 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 Spontaneous abortion 12/20/2015  . Missed menses 12/08/2015  . Ingrowing toenail with infection 10/05/2015  . Well woman exam with routine gynecological exam 04/26/2015  . Vulvar irritation 04/24/2015  . Bell's palsy 11/02/2014  . Skin lesion 10/04/2013  . Ulcer aphthous oral 05/13/2013  . Cervical cancer screening 09/12/2010  . MAJOR DEPRESSIVE DISORDER RECURRENT EPISODE MILD 12/14/2008   - Medications: reviewed and updated   Objective:   Physical Exam BP (!) 96/58   Pulse 72   Wt 124 lb 3.2 oz (56.3 kg)   LMP 12/13/2018 (Approximate) Comment: pt not sure of date  SpO2 100%   BMI 22.00 kg/m  Gen: NAD, alert, cooperative with exam, well-appearing GU: Normal vulva, vagina, and cervix without lesions or other abnormalities.  No abnormal vaginal discharge visualized.  No cervical motion tenderness.  Some cervical bleeding  occurred with Pap smear. Psych: good insight, alert and oriented        Assessment & Plan:   Vulvar irritation Wet prep negative for bacterial vaginosis, trichomonas, and yeast.  Although patient's irritation could be consistent with a yeast infection, she does not have any discharge on exam that appears consistent with this diagnosis.  Advised patient to use over-the-counter anti-itch vaginal creams and Replens vaginal moisturizer, and images were given to help guide her with this.  Also advised patient that we can retest her if her symptoms do not improve with time.  We will also follow-up her GC chlamydia result.  Cervical cancer screening Pap smear performed today.    Flu shot provided today.  Maia Breslow, M.D. 01/07/2019, 10:52 AM PGY-3, [place]

## 2019-01-19 LAB — CYTOLOGY - PAP
Chlamydia: NEGATIVE
Comment: NEGATIVE
Comment: NEGATIVE
Comment: NORMAL
Diagnosis: NEGATIVE
High risk HPV: NEGATIVE
Neisseria Gonorrhea: NEGATIVE

## 2019-01-20 ENCOUNTER — Telehealth: Payer: Self-pay

## 2019-01-20 NOTE — Telephone Encounter (Signed)
-----   Message from Kathrene Alu, MD sent at 01/20/2019  8:37 AM EDT ----- Would you call [name] and let her know that her pap smear was normal?  She can wait 5 years before her next pap.  Thanks.

## 2019-01-20 NOTE — Telephone Encounter (Signed)
Pt informed using [facility] interpreters [phone]). Ottis Stain, CMA

## 2019-05-06 NOTE — Progress Notes (Deleted)
   CHIEF COMPLAINT / HPI:  Back pain   Stomach pain  Vaccines TDAP  PERTINENT  PMH / PSH: ***   OBJECTIVE: There were no vitals taken for this visit.  ***  ASSESSMENT / PLAN:  No problem-specific Assessment & Plan notes found for this encounter.     Audrey Mo, MD [facility]

## 2019-05-07 ENCOUNTER — Ambulatory Visit: Payer: No Typology Code available for payment source

## 2019-06-17 ENCOUNTER — Encounter: Payer: Self-pay | Admitting: Family Medicine

## 2019-06-17 NOTE — Progress Notes (Signed)
   Subjective:  CC -- Annual Physical Pt reports she is doing well.  She had Covid in 09/2018, reports she still has difficulty with smell.  She worries sometimes for her husband as he intermittently battles depression, fortunately restarted working recently after the Covid pandemic for the past 2 months.  She has been working 2 jobs and handles as well.  She has been trying to eat better, cut out sodas and a lot of junk food when she was told she had high triglycerides and borderline sugar in the past.  Cardiovascular: - Dx Hypertension: no  - Dx Hyperlipidemia: no - Dx Obesity: no  - Physical Activity: Minimally, has been thinking of starting Zumba with her friends - Diabetes: no, but has been told previously she had "borderline sugar "  Cancer: Colorectal >> Colonoscopy: at 50, no family history of colon cancer Lung >> Tobacco Use: none  Breast >> Mammogram: Will discuss  Cervical/Endometrial >>  - Postmenopausal: No  - Pap Smear: Due in 12/2023, last in 2020 was normal with HPV negative   - Previous Abnormal Pap: no  Skin >> Suspicious lesions: no   Social: Alcohol Use: no  Tobacco Use: no  Other Drugs: no  Interested in screening for STDS: no  Depression: no  Support and Life at Home: yes   Other: Osteoporosis: no  Zoster Vaccine: no  Flu Vaccine: no  Pneumonia Vaccine: no Due to TDap.  Past Medical History and Medications- reviewed and updated   Objective: BP 100/62   Pulse 71   Ht 5' 2.5" (1.588 m)   Wt 122 lb 12.8 oz (55.7 kg)   SpO2 99%   BMI 22.10 kg/m  Gen: NAD, alert, cooperative with exam HEENT: NCAT, EOMI CV: RRR, good S1/S2, no murmur Resp: CTABL, no wheezes, non-labored Abd: Soft Genital Exam: not done Ext: No edema, warm Neuro: Alert and oriented, No gross deficits  Assessment/Plan:  Annual physical exam Reviewed past medical, surgical, and social history.  Medications updated in epic.  Health maintenance reviewed and due for Tdap  (postponed, discussed below). Encouraged working towards a well-balanced diet and giving Zumba a try with her friends.  Attempted to collect screening A1c and lipid panel today, however will postpone until she is able to reestablish her financial aid.  Placed in as future orders.  MAJOR DEPRESSIVE DISORDER RECURRENT EPISODE MILD She currently is doing quite well, working with her husband who battles from intermittent depression.  Not interested in any therapy at this time.   Follow-up for labs/Tdap, otherwise follow-up in 1 year or sooner if needed  Allayne Stack, DO 06/18/2019 5:19 PM

## 2019-06-18 ENCOUNTER — Encounter: Payer: Self-pay | Admitting: Family Medicine

## 2019-06-18 ENCOUNTER — Other Ambulatory Visit: Payer: Self-pay

## 2019-06-18 ENCOUNTER — Ambulatory Visit (INDEPENDENT_AMBULATORY_CARE_PROVIDER_SITE_OTHER): Payer: Self-pay | Admitting: Family Medicine

## 2019-06-18 VITALS — BP 100/62 | HR 71 | Ht 62.5 in | Wt 122.8 lb

## 2019-06-18 DIAGNOSIS — F330 Major depressive disorder, recurrent, mild: Secondary | ICD-10-CM

## 2019-06-18 DIAGNOSIS — Z0000 Encounter for general adult medical examination without abnormal findings: Secondary | ICD-10-CM

## 2019-06-18 DIAGNOSIS — U071 COVID-19: Secondary | ICD-10-CM

## 2019-06-18 DIAGNOSIS — E781 Pure hyperglyceridemia: Secondary | ICD-10-CM

## 2019-06-18 NOTE — Assessment & Plan Note (Signed)
Reviewed past medical, surgical, and social history.  Medications updated in epic.  Health maintenance reviewed and due for Tdap (postponed, discussed below). Encouraged working towards a well-balanced diet and giving Zumba a try with her friends.  Attempted to collect screening A1c and lipid panel today, however will postpone until she is able to reestablish her financial aid.  Placed in as future orders.

## 2019-06-18 NOTE — Patient Instructions (Signed)
It was wonderful to meet you today!  I encourage you to start [employer]a with your friends and maintaining a well balanced diet. I will let you know the results of your tests next week. Please make sure you schedule your mammogram soon.

## 2019-06-18 NOTE — Assessment & Plan Note (Addendum)
She currently is doing quite well, working with her husband who battles from intermittent depression.  Not interested in any therapy at this time.

## 2019-09-15 ENCOUNTER — Ambulatory Visit (HOSPITAL_COMMUNITY)
Admission: EM | Admit: 2019-09-15 | Discharge: 2019-09-15 | Disposition: A | Discharge status: Home / Self Care | Payer: HRSA Program | Attending: Urgent Care | Admitting: Urgent Care

## 2019-09-15 ENCOUNTER — Other Ambulatory Visit: Payer: Self-pay

## 2019-09-15 ENCOUNTER — Encounter (HOSPITAL_COMMUNITY): Payer: Self-pay

## 2019-09-15 DIAGNOSIS — R05 Cough: Secondary | ICD-10-CM | POA: Insufficient documentation

## 2019-09-15 DIAGNOSIS — R5381 Other malaise: Secondary | ICD-10-CM | POA: Diagnosis not present

## 2019-09-15 DIAGNOSIS — J310 Chronic rhinitis: Secondary | ICD-10-CM

## 2019-09-15 DIAGNOSIS — Z20822 Contact with and (suspected) exposure to covid-19: Secondary | ICD-10-CM | POA: Diagnosis not present

## 2019-09-15 DIAGNOSIS — R059 Cough, unspecified: Secondary | ICD-10-CM

## 2019-09-15 MED ORDER — CETIRIZINE HCL 10 MG PO TABS: 10.0000 mg | ORAL_TABLET | Freq: Every day | ORAL | 0 refills | Status: DC

## 2019-09-15 MED ORDER — BENZONATATE 100 MG PO CAPS: 100.0000 mg | ORAL_CAPSULE | Freq: Three times a day (TID) | ORAL | 0 refills | Status: DC | PRN

## 2019-09-15 MED ORDER — PREDNISONE 20 MG PO TABS
ORAL_TABLET | ORAL | 0 refills | Status: DC
Start: 2019-09-15 — End: 2020-02-07

## 2019-09-15 MED ORDER — MONTELUKAST SODIUM 10 MG PO TABS
10.0000 mg | ORAL_TABLET | Freq: Every day | ORAL | 0 refills | Status: DC
Start: 2019-09-15 — End: 2020-02-07

## 2019-09-15 MED ORDER — PROMETHAZINE-DM 6.25-15 MG/5ML PO SYRP
5.0000 mL | ORAL_SOLUTION | Freq: Every evening | ORAL | 0 refills | Status: DC | PRN
Start: 2019-09-15 — End: 2019-09-15

## 2019-09-15 MED ORDER — PROMETHAZINE-DM 6.25-15 MG/5ML PO SYRP
5.0000 mL | ORAL_SOLUTION | Freq: Every evening | ORAL | 0 refills | Status: DC | PRN
Start: 2019-09-15 — End: 2020-02-07

## 2019-09-15 MED ORDER — PREDNISONE 20 MG PO TABS: ORAL_TABLET | ORAL | 0 refills | Status: DC

## 2019-09-15 NOTE — ED Triage Notes (Signed)
Pt presents today with cough, runny nose, and feels like she is "catching a cold". Pt states cough is frequent and productive. Pt denies sick contacts. Pt denies sore throat, fever, runny nose, body aches, n/v/d. Pt has been treating with mucinex, which has been allowing her to sleep through the night. Pt also endorsing sinus pressure.

## 2019-09-15 NOTE — ED Provider Notes (Signed)
[facility]   MRN: [id number] DOB: May 04, 1975  Subjective:   Audrey Jensen is a 44 y.o. female presenting for 10-day history of acute on chronic sinus congestion, postnasal drainage, cough.  Patient has not had Covid vaccination, has very low suspicion for this but is agreeable to testing.  Has not tried many medications.  Admits that she has 2 jobs that regularly cause sinus congestion, also has a pet at home.  Does not take any antihistamine or nasal spray.  States that she regularly gets bronchitis and sees her PCP for this.  Has not been recommended long-term medications.  Denies fever, chest pain, shortness of breath, wheezing, sinus pain, ear pain.  Denies loss of sense of taste or smell.  No current facility-administered medications for this encounter.  Current Outpatient Medications:    Multiple Vitamin (MULTIVITAMIN) tablet, Take 1 tablet by mouth daily., Disp: , Rfl:    No Known Allergies  Past Medical History:  Diagnosis Date   Bell's palsy 11/02/2014   Ingrowing toenail with infection 10/05/2015   Skin lesion 10/04/2013   Spontaneous abortion 12/20/2015   Tension headache 10/06/2018   Ulcer aphthous oral 05/13/2013   Vulvar irritation 04/24/2015     History reviewed. No pertinent surgical history.  Family History  Problem Relation Age of Onset   Diabetes Brother     Social History   Tobacco Use   Smoking status: Never Smoker   Smokeless tobacco: Never Used  Vaping Use   Vaping Use: Never used  Substance Use Topics   Alcohol use: No   Drug use: No    ROS   Objective:   Vitals: BP 112/62 (BP Location: Right Arm)    Pulse 65    Temp 98.2 F (36.8 C) (Oral)    Resp 18    LMP 09/05/2019 (Exact Date)    SpO2 98%   Physical Exam Constitutional:      General: She is not in acute distress.    Appearance: Normal appearance. She is well-developed. She is not ill-appearing, toxic-appearing or diaphoretic.  HENT:     Head:  Normocephalic and atraumatic.     Right Ear: Tympanic membrane and ear canal normal. No drainage or tenderness. No middle ear effusion. Tympanic membrane is not erythematous.     Left Ear: Tympanic membrane and ear canal normal. No drainage or tenderness.  No middle ear effusion. Tympanic membrane is not erythematous.     Nose: Nose normal. No congestion or rhinorrhea.     Mouth/Throat:     Mouth: Mucous membranes are moist. No oral lesions.     Pharynx: No pharyngeal swelling, oropharyngeal exudate, posterior oropharyngeal erythema or uvula swelling.     Tonsils: No tonsillar exudate or tonsillar abscesses.     Comments: Significant postnasal drainage overlying pharynx. Eyes:     Extraocular Movements: Extraocular movements intact.     Right eye: Normal extraocular motion.     Left eye: Normal extraocular motion.     Conjunctiva/sclera: Conjunctivae normal.     Pupils: Pupils are equal, round, and reactive to light.  Cardiovascular:     Rate and Rhythm: Normal rate and regular rhythm.     Pulses: Normal pulses.     Heart sounds: Normal heart sounds. No murmur heard.  No friction rub. No gallop.   Pulmonary:     Effort: Pulmonary effort is normal. No respiratory distress.     Breath sounds: Normal breath sounds. No stridor. No wheezing, rhonchi or rales.  Musculoskeletal:     Cervical back: Normal range of motion and neck supple.  Lymphadenopathy:     Cervical: No cervical adenopathy.  Skin:    General: Skin is warm and dry.     Findings: No rash.  Neurological:     General: No focal deficit present.     Mental Status: She is alert and oriented to person, place, and time.  Psychiatric:        Mood and Affect: Mood normal.        Behavior: Behavior normal.        Thought Content: Thought content normal.     Assessment and Plan :   PDMP not reviewed this encounter.  1. Chronic rhinitis   2. Cough   3. Malaise     Oral prednisone course to help her current symptoms set.   Recommended starting Singulair and Zyrtec long-term.  Use supportive care otherwise.  COVID-19 testing pending. Counseled patient on potential for adverse effects with medications prescribed/recommended today, ER and return-to-clinic precautions discussed, patient verbalized understanding.    Jaynee Eagles, [place] 09/15/19 2034

## 2019-09-16 LAB — SARS CORONAVIRUS 2 (TAT 6-24 HRS): SARS Coronavirus 2: NEGATIVE

## 2019-09-23 ENCOUNTER — Other Ambulatory Visit (INDEPENDENT_AMBULATORY_CARE_PROVIDER_SITE_OTHER): Payer: Self-pay

## 2019-09-23 ENCOUNTER — Other Ambulatory Visit: Payer: Self-pay | Admitting: *Deleted

## 2019-09-23 ENCOUNTER — Other Ambulatory Visit: Payer: Self-pay

## 2019-09-23 DIAGNOSIS — Z0000 Encounter for general adult medical examination without abnormal findings: Secondary | ICD-10-CM

## 2019-09-23 DIAGNOSIS — E781 Pure hyperglyceridemia: Secondary | ICD-10-CM

## 2019-09-23 DIAGNOSIS — Z1231 Encounter for screening mammogram for malignant neoplasm of breast: Secondary | ICD-10-CM

## 2019-09-24 LAB — LIPID PANEL
Chol/HDL Ratio: 2.7 ratio (ref 0.0–4.4)
Cholesterol, Total: 154 mg/dL (ref 100–199)
HDL: 57 mg/dL (ref >39)
LDL Chol Calc (NIH): 75 mg/dL (ref 0–99)
Triglycerides: 123 mg/dL (ref 0–149)
VLDL Cholesterol Cal: 22 mg/dL (ref 5–40)

## 2019-09-28 LAB — POCT GLYCOSYLATED HEMOGLOBIN (HGB A1C): Hemoglobin A1C: 5.5 % (ref 4.0–5.6)

## 2019-10-15 ENCOUNTER — Emergency Department (HOSPITAL_COMMUNITY): Payer: Self-pay

## 2019-10-15 ENCOUNTER — Other Ambulatory Visit: Payer: Self-pay

## 2019-10-15 ENCOUNTER — Emergency Department (HOSPITAL_COMMUNITY)
Admission: EM | Admit: 2019-10-15 | Discharge: 2019-10-15 | Disposition: A | Discharge status: Home / Self Care | Payer: Self-pay | Attending: Emergency Medicine | Admitting: Emergency Medicine

## 2019-10-15 ENCOUNTER — Encounter (HOSPITAL_COMMUNITY): Payer: Self-pay | Admitting: Emergency Medicine

## 2019-10-15 DIAGNOSIS — R300 Dysuria: Secondary | ICD-10-CM | POA: Insufficient documentation

## 2019-10-15 DIAGNOSIS — R110 Nausea: Secondary | ICD-10-CM | POA: Insufficient documentation

## 2019-10-15 DIAGNOSIS — Z79899 Other long term (current) drug therapy: Secondary | ICD-10-CM | POA: Insufficient documentation

## 2019-10-15 DIAGNOSIS — R9389 Abnormal findings on diagnostic imaging of other specified body structures: Secondary | ICD-10-CM

## 2019-10-15 DIAGNOSIS — R102 Pelvic and perineal pain: Secondary | ICD-10-CM

## 2019-10-15 DIAGNOSIS — Z8616 Personal history of COVID-19: Secondary | ICD-10-CM | POA: Insufficient documentation

## 2019-10-15 LAB — CBC
HCT: 32.6 % — ABNORMAL LOW (ref 36.0–46.0)
Hemoglobin: 10.9 g/dL — ABNORMAL LOW (ref 12.0–15.0)
MCH: 28.7 pg (ref 26.0–34.0)
MCHC: 33.4 g/dL (ref 30.0–36.0)
MCV: 85.8 fL (ref 80.0–100.0)
Platelets: 257 10*3/uL (ref 150–400)
RBC: 3.8 MIL/uL — ABNORMAL LOW (ref 3.87–5.11)
RDW: 13.2 % (ref 11.5–15.5)
WBC: 7.3 10*3/uL (ref 4.0–10.5)
nRBC: 0 % (ref 0.0–0.2)

## 2019-10-15 LAB — URINALYSIS, ROUTINE W REFLEX MICROSCOPIC
Bilirubin Urine: NEGATIVE
Glucose, UA: NEGATIVE mg/dL
Ketones, ur: NEGATIVE mg/dL
Leukocytes,Ua: NEGATIVE
Nitrite: NEGATIVE
Protein, ur: NEGATIVE mg/dL
Specific Gravity, Urine: 1.002 — ABNORMAL LOW (ref 1.005–1.030)
pH: 8 (ref 5.0–8.0)

## 2019-10-15 LAB — COMPREHENSIVE METABOLIC PANEL
ALT: 13 U/L (ref 0–44)
AST: 17 U/L (ref 15–41)
Albumin: 3.7 g/dL (ref 3.5–5.0)
Alkaline Phosphatase: 49 U/L (ref 38–126)
Anion gap: 9 (ref 5–15)
BUN: 15 mg/dL (ref 6–20)
CO2: 21 mmol/L — ABNORMAL LOW (ref 22–32)
Calcium: 8.9 mg/dL (ref 8.9–10.3)
Chloride: 109 mmol/L (ref 98–111)
Creatinine, Ser: 0.57 mg/dL (ref 0.44–1.00)
GFR calc Af Amer: >60 mL/min (ref >60)
GFR calc non Af Amer: >60 mL/min (ref >60)
Glucose, Bld: 112 mg/dL — ABNORMAL HIGH (ref 70–99)
Potassium: 3.4 mmol/L — ABNORMAL LOW (ref 3.5–5.1)
Sodium: 139 mmol/L (ref 135–145)
Total Bilirubin: 0.5 mg/dL (ref 0.3–1.2)
Total Protein: 6.7 g/dL (ref 6.5–8.1)

## 2019-10-15 LAB — I-STAT BETA HCG BLOOD, ED (MC, WL, AP ONLY): I-stat hCG, quantitative: <5 m[IU]/mL (ref <5)

## 2019-10-15 LAB — LIPASE, BLOOD: Lipase: 36 U/L (ref 11–51)

## 2019-10-15 MED ORDER — SODIUM CHLORIDE 0.9% FLUSH: 3.0000 mL | Freq: Once | INTRAVENOUS | Status: DC

## 2019-10-15 MED ORDER — IBUPROFEN 400 MG PO TABS
600.0000 mg | ORAL_TABLET | Freq: Once | ORAL | Status: AC
  Administered 2019-10-15: 600 mg via ORAL
  Filled 2019-10-15: qty 1

## 2019-10-15 NOTE — ED Provider Notes (Signed)
I received patient as a handoff at shift change from [facility], PA-C.  In short, patient complains of a 2-week history of RLQ abdominal discomfort with radiation to her low back.  Patient endorses nausea and dysuria, but denies any emesis, fevers, changes in bowel habits, vaginal discharge/pain, or other symptoms.  Laboratory work-up was unremarkable and CT abdomen pelvis was personally reviewed and demonstrates no acute or emergent pathology.  Handoff provider offered pelvic ultrasound to assess for ovarian torsion or other pelvic pathology.  [place] is all that is pending at time of handoff.  AVS completed by [place], PA-C.  Recommending that patient have laboratory recheck by her primary care provider in 3 to 5 days.  Patient had mild normocytic anemia orders, a bit lower from her baseline.  She denies any obvious bleeding and is hemodynamically stable.  We will also encourage her to eat foods rich in potassium as she is mildly below normal limits here in the ED.    US pelvic ultrasound complete with transvaginal and torsion rule out was reviewed and demonstrates no acute or emergent pathology.  There are mildly enlarged ovaries bilaterally with small, complex cysts, however they are within physiologic limits and require no follow-up ultrasound or screening.    On my examination, patient did have reproducible RLQ abdominal tenderness.  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  She suspects that this is musculoskeletal in etiology.  I agree with her assessment.  I encouraged her to take a couple days off of work if possible.  She became teary-eyed and states that she has been stressed with how much she has had to work due to her husband being unemployed.  Patient will follow up with her PCP.   Lorelee New, PA-C 10/15/19 1062    Eber Hong, MD 10/17/19 [phone]

## 2019-10-15 NOTE — Discharge Instructions (Addendum)
Follow up with your doctor for recheck of ongoing lower abdominal/pelvic pain. You can take ibuprofen 600 mg (3 of the over-the-counter strength tablets) for pain every 6-8 hours. Warm compresses may help with discomfort as well.   I would also like you to follow-up for laboratory recheck given your mild anemia and mildly low potassium.  Be sure to eat potassium rich foods such as bananas and potatoes.    Return to the ED or seek immediate medical attention to experience any new or worsening symptoms.  Haga un seguimiento con su mdico para volver a Human resources officer abdominal inferior / plvico continuo. Puede tomar ibuprofeno 600 mg (3 de las tabletas de concentracin de venta libre) para Chief Technology Officer cada 6 a 8 horas. Las compresas tibias tambin pueden ayudar con Environmental health practitioner.  Tambin me gustara que hiciera un seguimiento para una nueva revisin de laboratorio debido a su anemia leve y niveles levemente bajos de potasio. Asegrese de comer alimentos ricos en potasio como pltanos y patatas.  Regrese al servicio de urgencias o busque atencin mdica inmediata si experimenta cualquier sntoma nuevo o que empeora.

## 2019-10-15 NOTE — ED Triage Notes (Signed)
Patient here with right flank pain that radiates down into her pelvis and into leg.  She does have some nausea, not hungry, no vomiting.  Patient stated she did have some burning with urination.

## 2019-10-15 NOTE — ED Provider Notes (Signed)
[facility] EMERGENCY DEPARTMENT Provider Note   CSN: [id number] Arrival date & time: 10/15/19  0244     History Chief Complaint  Patient presents with   Flank Pain    Audrey Jensen is a 44 y.o. female.  Patient to ED with pain in the right lower abdomen that started 2 weeks ago. The pain has progressed in intensity and started radiating to the right lower back. She reports burning with urination but no fever, vaginal discharge, irregular bleeding. She went to her gynecologist at the onset of pain and was diagnosed with a yeast infection. No vaginal itching now or at that time. She reports nausea with the worst pain but no vomiting. No fever. No change in bowel movements.   The history is provided by the patient. No language interpreter was used.  Flank Pain Associated symptoms include abdominal pain. Pertinent negatives include no chest pain and no shortness of breath.       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 No pertinent surgical history.   OB History    Gravida  4   Para  2   Term  2   Preterm  0   AB  2   Living  2     SAB  2   TAB      Ectopic  0   Multiple  0   Live Births  2        Obstetric Comments  [id number]        Family History  Problem Relation Age of Onset   Diabetes Brother     Social History   Tobacco Use   Smoking status: Never Smoker   Smokeless tobacco: Never Used  Vaping Use   Vaping Use: Never used  Substance Use  Topics   Alcohol use: No   Drug use: No    Home Medications Prior to Admission medications   Medication Sig Start Date End Date Taking? Authorizing Provider  benzonatate (TESSALON) 100 MG capsule Take 1-2 capsules (100-200 mg total) by mouth 3 (three) times daily as needed. 09/15/19   Wallis Bamberg, PA-C  cetirizine (ZYRTEC ALLERGY) 10 MG tablet Take 1 tablet (10 mg total) by mouth daily. 09/15/19   Wallis Bamberg, PA-C  montelukast (SINGULAIR) 10 MG tablet Take 1 tablet (10 mg total) by mouth at bedtime. 09/15/19   Wallis Bamberg, PA-C  Multiple Vitamin (MULTIVITAMIN) tablet Take 1 tablet by mouth daily.    [provider]  predniSONE (DELTASONE) 20 MG tablet Take 2 tablets daily with breakfast. 09/15/19   Wallis Bamberg, PA-C  promethazine-dextromethorphan (PROMETHAZINE-DM) 6.25-15 MG/5ML syrup Take 5 mLs by mouth at bedtime as needed for cough. 09/15/19   Wallis Bamberg, PA-C    Allergies    Patient has no known allergies.  Review of Systems   Review of Systems  Constitutional: Negative for chills and fever.  Respiratory: Negative.  Negative for shortness of breath.   Cardiovascular: Negative.  Negative for chest pain.  Gastrointestinal: Positive for abdominal pain  and nausea. Negative for constipation, diarrhea and vomiting.  Genitourinary: Positive for dysuria and flank pain. Negative for vaginal bleeding and vaginal discharge.  Musculoskeletal: Positive for back pain.  Skin: Negative.   Neurological: Negative.     Physical Exam Updated Vital Signs BP 129/88 (BP Location: Left Arm)    Pulse 73    Temp 98.2 F (36.8 C) (Oral)    Resp 16    Ht 5\' 3"  (1.6 m)    Wt 72.6 kg    LMP 10/10/2019 (Exact Date)    SpO2 100%    BMI 28.34 kg/m   Physical Exam Vitals and nursing note reviewed.  Constitutional:      Appearance: She is well-developed.  Pulmonary:     Effort: Pulmonary effort is normal.  Abdominal:     Palpations: Abdomen is soft.     Tenderness: There is abdominal  tenderness. There is no guarding.    Musculoskeletal:        General: Normal range of motion.     Cervical back: Normal range of motion.  Skin:    General: Skin is warm and dry.  Neurological:     Mental Status: She is alert and oriented to person, place, and time.     ED Results / Procedures / Treatments   Labs (all labs ordered are listed, but only abnormal results are displayed) Labs Reviewed  COMPREHENSIVE METABOLIC PANEL - Abnormal; Notable for the following components:      Result Value   Potassium 3.4 (*)    CO2 21 (*)    Glucose, Bld 112 (*)    All other components within normal limits  CBC - Abnormal; Notable for the following components:   RBC 3.80 (*)    Hemoglobin 10.9 (*)    HCT 32.6 (*)    All other components within normal limits  URINALYSIS, ROUTINE W REFLEX MICROSCOPIC - Abnormal; Notable for the following components:   Color, Urine COLORLESS (*)    Specific Gravity, Urine 1.002 (*)    Hgb urine dipstick MODERATE (*)    Bacteria, UA RARE (*)    All other components within normal limits  LIPASE, BLOOD  I-STAT BETA HCG BLOOD, ED ([facility], [facility], [facility] ONLY)   Results for orders placed or performed during the hospital encounter of 10/15/19  Lipase, blood  Result Value Ref Range   Lipase 36 11 - 51 U/L  Comprehensive metabolic panel  Result Value Ref Range   Sodium 139 135 - 145 mmol/L   Potassium 3.4 (L) 3.5 - 5.1 mmol/L   Chloride 109 98 - 111 mmol/L   CO2 21 (L) 22 - 32 mmol/L   Glucose, Bld 112 (H) 70 - 99 mg/dL   BUN 15 6 - 20 mg/dL   Creatinine, Ser 10/17/19 0.44 - 1.00 mg/dL   Calcium 8.9 8.9 - 5.85 mg/dL   Total Protein 6.7 6.5 - 8.1 g/dL   Albumin 3.7 3.5 - 5.0 g/dL   AST 17 15 - 41 U/L   ALT 13 0 - 44 U/L   Alkaline Phosphatase 49 38 - 126 U/L   Total Bilirubin 0.5 0.3 - 1.2 mg/dL   GFR calc non Af Amer >60 >60 mL/min   GFR calc Af Amer >60 >60 mL/min   Anion gap 9 5 - 15  CBC  Result Value Ref Range   WBC 7.3 4.0 - 10.5 K/uL   RBC 3.80 (L) 3.87  - 5.11 MIL/uL   Hemoglobin 10.9 (L) 12.0 -  15.0 g/dL   HCT 47.0 (L) 36 - 46 %   MCV 85.8 80.0 - 100.0 fL   MCH 28.7 26.0 - 34.0 pg   MCHC 33.4 30.0 - 36.0 g/dL   RDW 96.2 83.6 - 62.9 %   Platelets 257 150 - 400 K/uL   nRBC 0.0 0.0 - 0.2 %  Urinalysis, Routine w reflex microscopic  Result Value Ref Range   Color, Urine COLORLESS (A) YELLOW   APPearance CLEAR CLEAR   Specific Gravity, Urine 1.002 (L) 1.005 - 1.030   pH 8.0 5.0 - 8.0   Glucose, UA NEGATIVE NEGATIVE mg/dL   Hgb urine dipstick MODERATE (A) NEGATIVE   Bilirubin Urine NEGATIVE NEGATIVE   Ketones, ur NEGATIVE NEGATIVE mg/dL   Protein, ur NEGATIVE NEGATIVE mg/dL   Nitrite NEGATIVE NEGATIVE   Leukocytes,Ua NEGATIVE NEGATIVE   RBC / HPF 0-5 0 - 5 RBC/hpf   WBC, UA 0-5 0 - 5 WBC/hpf   Bacteria, UA RARE (A) NONE SEEN   Squamous Epithelial / LPF 0-5 0 - 5  I-Stat beta hCG blood, ED  Result Value Ref Range   I-stat hCG, quantitative <5.0 <5 mIU/mL   Comment 3            EKG None  Radiology CT Renal Stone Study  Result Date: 10/15/2019 CLINICAL DATA:  Flank pain on the right with kidney stone suspected EXAM: CT ABDOMEN AND PELVIS WITHOUT CONTRAST TECHNIQUE: Multidetector CT imaging of the abdomen and pelvis was performed following the standard protocol without IV contrast. COMPARISON:  None. FINDINGS: Lower chest:  No contributory findings. Hepatobiliary: No focal liver abnormality.No evidence of biliary obstruction or stone. Pancreas: Unremarkable. Spleen: Unremarkable. Adrenals/Urinary Tract: Negative adrenals. No hydronephrosis or stone. Unremarkable bladder. Stomach/Bowel: No obstruction. High-density appendiceal contents without acute inflammation. No visible bowel inflammation of any kind. Vascular/Lymphatic: No acute vascular abnormality. No mass or adenopathy. Reproductive:2.2 cm right adnexal cystic density, usually follicular. Other: No ascites or pneumoperitoneum. Musculoskeletal: No acute abnormalities. IMPRESSION:  No acute finding.  No hydronephrosis or urolithiasis. Electronically Signed   By: Marnee Spring M.D.   On: 10/15/2019 04:59    Procedures Procedures (including critical care time)  Medications Ordered in ED Medications  sodium chloride flush (NS) 0.9 % injection 3 mL (has no administration in time range)    ED Course  I have reviewed the triage vital signs and the nursing notes.  Pertinent labs & imaging results that were available during my care of the patient were reviewed by me and considered in my medical decision making (see chart for details).    MDM Rules/Calculators/A&P                          Patient to ED with abdominal pain as per HPI.   Labs unremarkable. No fever. Pain x 2 weeks. Doubt emergent condition. CT shows cystic density involving the right ovary measuring 2.2 cm.  This was discussed with the patient who has concerns about a definitive diagnosis. Pelvic ultrasound offered and patient would like to proceed with study for full evaluation.   Patient care signed out to Deforest Hoyles, PA-C, pending [place] result. Anticipate discharge home with GYN follow up.   Final Clinical Impression(s) / ED Diagnoses Final diagnoses:  None   1. Pelvic pain  Rx / DC Orders ED Discharge Orders    None       Danne Harbor 10/15/19 4765    Mesner, Barbara Cower, MD  10/15/19 2330 ° °

## 2019-10-19 ENCOUNTER — Ambulatory Visit (INDEPENDENT_AMBULATORY_CARE_PROVIDER_SITE_OTHER): Payer: Self-pay | Admitting: Family Medicine

## 2019-10-19 ENCOUNTER — Other Ambulatory Visit: Payer: Self-pay

## 2019-10-19 VITALS — BP 100/70 | HR 93 | Ht 63.0 in | Wt 119.4 lb

## 2019-10-19 DIAGNOSIS — R102 Pelvic and perineal pain: Secondary | ICD-10-CM

## 2019-10-19 DIAGNOSIS — N83201 Unspecified ovarian cyst, right side: Secondary | ICD-10-CM

## 2019-10-19 DIAGNOSIS — N83202 Unspecified ovarian cyst, left side: Secondary | ICD-10-CM

## 2019-10-19 LAB — POCT URINALYSIS DIP (MANUAL ENTRY)
Bilirubin, UA: NEGATIVE
Blood, UA: NEGATIVE
Glucose, UA: NEGATIVE mg/dL
Ketones, POC UA: NEGATIVE mg/dL
Leukocytes, UA: NEGATIVE
Nitrite, UA: NEGATIVE
Protein Ur, POC: NEGATIVE mg/dL
Spec Grav, UA: 1.01 (ref 1.010–1.025)
Urobilinogen, UA: 0.2 E.U./dL
pH, UA: 6.5 (ref 5.0–8.0)

## 2019-10-19 LAB — POCT URINE PREGNANCY: Preg Test, Ur: NEGATIVE

## 2019-10-19 MED ORDER — TRAMADOL HCL 50 MG PO TABS: 50.0000 mg | ORAL_TABLET | Freq: Every evening | ORAL | 0 refills | Status: AC | PRN

## 2019-10-19 NOTE — Progress Notes (Addendum)
Subjective:   Patient ID: Audrey Jensen    DOB: 27-Oct-1975, 44 y.o. female   MRN: [id number]  Audrey Jensen is a 44 y.o. female with a history of MDD, stress, and presbyopia here for abdominal pain.  Abdominal Pain: Pain began ~5-7 days ago Medications tried: Ibuprofen every 6 hours Similar pain before: had similar less intense pain a few weeks ago, self resolved  Prior abdominal surgeries: none  Symptoms Nausea/vomiting: none Diarrhea: one episode yesterday that has now resolved Constipation: none Blood in stool: None Blood in vomit: None Fever: none Dysuria: yes Loss of appetite: None Weight loss: has lost weight since having COVID last year, her symptoms included lots of nausea/vomiting, couldn't eat for weeks.  Vaginal Bleeding: LMP 10/03/19, regularly monthly, regular flow  Missed menstrual period: none  Review of Symptoms - see HPI PMH - Smoking status noted.    Was seen in ED on 10/15/19 with right lower abdominal pain that started 2 weeks prior. The pain had progressed in intensity and started radiating to her right lower back. She saw her gynecologist at onset of pain and found to have yeast infection. Denied any vomiting, fever, or changes in bowel movements.  Labs notable for Na 139, K3.4, Hgb 10.9. UA with moderate Hgb, rare bacteria, 0-5 RBC's. Normal kidney and liver function. Lipase 36. WBC 7.3. <5.0 b-Hcg.  CT renal stone showed cystic density involving right ovary measuring 2.2cm.  No stones or other findings. Pelvic ultrasound negative for ovarian torsion. Mild enlargement of both ovaries with small simple and mildly complex cysts - within physiologic limits and require no specific ultrasound follow up.   Review of Systems:  Per HPI.   Objective:   BP 100/70   Pulse 93   Ht 5\' 3"  (1.6 m)   Wt 119 lb 6.4 oz (54.2 kg)   LMP 10/10/2019 (Exact Date)   SpO2 99%   BMI 21.15 kg/m  Vitals and nursing note reviewed.  General: Pleasant  young thin female, sitting comfortably on exam bed, well nourished, well developed, in no acute distress with non-toxic appearance HEENT: normocephalic, atraumatic, moist mucous membranes Resp: Breathing comfortably on room air, speaking in full sentences Abdomen: soft, pain to palpation localized to suprapubic area and medial right lower pelvic region, otherwise abdomen nontender to palpation, negative McBurney's point, no rebound tenderness, non-distended, no masses or organomegaly palpable, normoactive bowel sounds Skin: warm, dry Extremities: warm and well perfused MSK:  gait normal, right hip joint nontender to palpation, normal ROM with flexion, extension, IR/ER, negative FABER/FADIR Neuro: Alert and oriented, speech normal  Assessment & Plan:   Pelvic pain Acute medial to right suprapubic pelvic pain. Uncertain prognosis. Was seen in ED and had extensive work-up that ruled out infectious cause, ovarian torsion, appendicitis, ectopic pregnancy, UTI, pregnancy, kidney stone.  Repeat UA and pregnancy test today also negative.  Lack of GU symptoms as well as patient being in a monogamous relationship with her husband, doubt STI's.  Unlikely referred pain from hip given normal exam.  Pelvic ultrasound did note mild enlargement of both ovaries with small simple and mildly complex cysts which I suspect is the cause of her symptoms at this time given negative work-up.  She is hemodynamically stable and appears comfortable on exam today.  Recommended conservative therapy with pain management at this time, return if no improvement or worsening pain.  Strict return precautions discussed.  Patient voiced understanding agreement plan. -Scheduled ibuprofen and Tylenol daily for the next week -  Short trial of tramadol 50 mg nightly x 5 days, #5, no refills. Given to help with her more severe nocturnal pain and allow for sleep. -Follow-up urine culture -RTC in 1 to 2 weeks if no improvement, sooner if  worsening pain or develops nausea/vomiting, fevers/chills, hematuria, or other concerning symptoms - if patient returns with worsening pain, would recommend repeat abdominal CT scan   Orders Placed This Encounter  Procedures  . Urine Culture  . POCT urinalysis dipstick  . POCT urine pregnancy   Meds ordered this encounter  Medications  . traMADol (ULTRAM) 50 MG tablet    Sig: Take 1 tablet (50 mg total) by mouth at bedtime as needed for up to 5 days for severe pain.    Dispense:  5 tablet    Refill:  0    Orpah Cobb, DO PGY-3, [facility] 10/20/2019 11:12 AM

## 2019-10-19 NOTE — Patient Instructions (Signed)
Thank you for coming to see me today. It was a pleasure to see you.   I believe your pelvic pain may be coming from the ovarian cysts.  These are very normal and usually self resolve in 1 to 2 weeks.  You can treat the pain with alternating Tylenol and ibuprofen throughout the day.  I have also prescribed you a few days of tramadol to help with the pain at night.  Please return if the pain significantly worsens, you develop nausea/vomiting, fever/chills, pelvic bleeding, or blood in your urine.  Today your urinalysis and your urine pregnancy test were negative.  I will send off a urine culture for further evaluation and call you if treatment is needed.  Please follow-up if the pain does not improve within 1 week.  If you have any questions or concerns, please do not hesitate to call the office at [phone].  Take Care,  Dr. Orpah Cobb, DO Resident Physician [facility] [phone]

## 2019-10-20 DIAGNOSIS — R102 Pelvic and perineal pain: Secondary | ICD-10-CM | POA: Insufficient documentation

## 2019-10-20 NOTE — Assessment & Plan Note (Addendum)
Acute medial to right suprapubic pelvic pain. Uncertain prognosis. Was seen in ED and had extensive work-up that ruled out infectious cause, ovarian torsion, appendicitis, ectopic pregnancy, UTI, pregnancy, kidney stone.  Repeat UA and pregnancy test today also negative.  Lack of GU symptoms as well as patient being in a monogamous relationship with her husband, doubt STI's.  Unlikely referred pain from hip given normal exam.  Pelvic ultrasound did note mild enlargement of both ovaries with small simple and mildly complex cysts which I suspect is the cause of her symptoms at this time given negative work-up.  She is hemodynamically stable and appears comfortable on exam today.  Recommended conservative therapy with pain management at this time, return if no improvement or worsening pain.  Strict return precautions discussed.  Patient voiced understanding agreement plan. -Scheduled ibuprofen and Tylenol daily for the next week -Short trial of tramadol 50 mg nightly x 5 days, #5, no refills. Given to help with her more severe nocturnal pain and allow for sleep. -Follow-up urine culture -RTC in 1 to 2 weeks if no improvement, sooner if worsening pain or develops nausea/vomiting, fevers/chills, hematuria, or other concerning symptoms - if patient returns with worsening pain, would recommend repeat abdominal CT scan

## 2019-10-22 LAB — URINE CULTURE

## 2019-10-24 ENCOUNTER — Other Ambulatory Visit: Payer: Self-pay | Admitting: Family Medicine

## 2019-10-24 DIAGNOSIS — B9689 Other specified bacterial agents as the cause of diseases classified elsewhere: Secondary | ICD-10-CM

## 2019-10-24 MED ORDER — CEPHALEXIN 500 MG PO CAPS: 500.0000 mg | ORAL_CAPSULE | Freq: Four times a day (QID) | ORAL | 0 refills | Status: AC

## 2019-10-26 ENCOUNTER — Other Ambulatory Visit: Payer: Self-pay

## 2019-10-26 ENCOUNTER — Ambulatory Visit
Admission: RE | Admit: 2019-10-26 | Discharge: 2019-10-26 | Disposition: A | Discharge status: Home / Self Care | Payer: No Typology Code available for payment source | Source: Ambulatory Visit | Attending: Obstetrics and Gynecology | Admitting: Obstetrics and Gynecology

## 2019-10-26 ENCOUNTER — Ambulatory Visit: Payer: Self-pay | Admitting: *Deleted

## 2019-10-26 VITALS — BP 118/72 | Temp 99.1°F | Wt 121.5 lb

## 2019-10-26 DIAGNOSIS — Z1231 Encounter for screening mammogram for malignant neoplasm of breast: Secondary | ICD-10-CM

## 2019-10-26 DIAGNOSIS — Z1239 Encounter for other screening for malignant neoplasm of breast: Secondary | ICD-10-CM

## 2019-10-26 NOTE — Patient Instructions (Addendum)
Informed Audrey Jensen about breast self awareness. Patient did not need a Pap smear today due to last Pap smear was on 01/27/2019. Let her know [facility] will cover Pap smears and HPV typing every 5 years unless has a history of abnormal Pap smears. Referred patient to the [facility] of [facility] for a screening mammogram on the mobile unit. Appointment scheduled October 26, 2019 at 2:30pm. Patient escorted to mobile unit for appointment. Let patient know the Breast Center will follow up with her within the next couple weeks with results of her mammogram by letter or phone. Alroy Bailiff Derrek Gu verbalized understanding.  Mila Homer, RN, FNP student [phone] PM

## 2019-10-26 NOTE — Progress Notes (Signed)
[name] is a 44 y.o. female who presents to [facility] clinic today with no complaints.    Pap Smear: Pap smear not completed today. Last Pap smear was 01/07/2019 at [facility] clinic and was normal with negative HPV. Per patient has no history of an abnormal Pap smear. Last Pap smear result is available in Epic.   Physical exam: Breasts Breasts symmetrical. No skin abnormalities bilateral breasts. No nipple retraction bilateral breasts. No nipple discharge bilateral breasts. No lymphadenopathy. No lumps palpated bilateral breasts.       Pelvic/Bimanual Pap is not indicated today.    Smoking History: Patient has never smoked.    Patient Navigation: Patient education provided. Access to services provided for patient through [facility] program. [name], Spanish interpreter provided through [facility]   Breast and Cervical Cancer Risk Assessment: Patient does not have family history of breast cancer, known genetic mutations, or radiation treatment to the chest before age 63. Patient does not have history of cervical dysplasia, immunocompromised, or DES exposure in-utero.  Risk Assessment    Risk Scores      10/26/2019 06/18/2018   Last edited by: Meryl Dare, CMA [name], LPN   5-year risk: 0.4 % 0.4 %   Lifetime risk: 5.6 % 5.7 %         A: [facility] exam without pap smear No complaints today.  P: Referred patient to the [facility] of [facility] for a screening mammogram on the mobile unit. Appointment scheduled October 26, 2019 at 2:30pm.  Mila Homer, RN, FNP student 10/26/2019 2:04 PM   Attestation of Supervision of Student:  I confirm that I have verified the information documented in the nurse practitioner student's note and that I have also personally reperformed the history, physical exam and all medical decision making activities.  I have verified that all services and findings are accurately documented in this student's note;  and I agree with management and plan as outlined in the documentation. I have also made any necessary editorial changes.  No complaints of pain or tenderness on exam.  Brannock, Kathaleen Maser, RN [facility] [employer], [facility] 10/26/2019 3:22 PM

## 2019-11-17 ENCOUNTER — Other Ambulatory Visit (HOSPITAL_COMMUNITY)
Admission: RE | Admit: 2019-11-17 | Discharge: 2019-11-17 | Disposition: A | Discharge status: Home / Self Care | Payer: No Typology Code available for payment source | Source: Ambulatory Visit | Attending: Family Medicine | Admitting: Family Medicine

## 2019-11-17 ENCOUNTER — Ambulatory Visit (INDEPENDENT_AMBULATORY_CARE_PROVIDER_SITE_OTHER): Payer: Self-pay | Admitting: Family Medicine

## 2019-11-17 ENCOUNTER — Encounter: Payer: Self-pay | Admitting: Family Medicine

## 2019-11-17 ENCOUNTER — Other Ambulatory Visit: Payer: Self-pay

## 2019-11-17 VITALS — BP 100/62 | HR 87 | Ht 63.0 in | Wt 123.2 lb

## 2019-11-17 DIAGNOSIS — R102 Pelvic and perineal pain: Secondary | ICD-10-CM

## 2019-11-17 LAB — POCT URINALYSIS DIP (MANUAL ENTRY)
Bilirubin, UA: NEGATIVE
Blood, UA: NEGATIVE
Glucose, UA: NEGATIVE mg/dL
Ketones, POC UA: NEGATIVE mg/dL
Leukocytes, UA: NEGATIVE
Nitrite, UA: NEGATIVE
Protein Ur, POC: NEGATIVE mg/dL
Spec Grav, UA: 1.025 (ref 1.010–1.025)
Urobilinogen, UA: 0.2 E.U./dL
pH, UA: 6 (ref 5.0–8.0)

## 2019-11-17 LAB — POCT WET PREP (WET MOUNT)
Clue Cells Wet Prep Whiff POC: NEGATIVE
Trichomonas Wet Prep HPF POC: ABSENT
WBC, Wet Prep HPF POC: >20

## 2019-11-17 LAB — POCT URINE PREGNANCY: Preg Test, Ur: NEGATIVE

## 2019-11-17 NOTE — Patient Instructions (Signed)
It was wonderful to see you today.  We will be rechecking your urine to see if this continues to grow any bacteria.  Please make sure that you are drinking plenty of water.  You may be having some small amount of dropping of your uterus and bladder when you are standing for long periods of time that can cause some of your discomfort.  Please start doing the exercises attached several times a day to strengthen your pelvic muscles.  Follow-up pending additional results.  If you get a fever, abdominal pain becomes more severe, or unable to maintain hydration due to any nausea/vomiting please go to the ED.

## 2019-11-17 NOTE — Progress Notes (Signed)
    SUBJECTIVE:   CHIEF COMPLAINT / HPI: Continued abdominal pain  [name] is a 44 year old female presenting for follow-up of continued abdominal pain.  She was recently seen in our clinic on 7/20 and in the ED on 7/16 for this concern.  Already had a pelvic ultrasound without evidence of torsion, showed few small and mildly complex cyst within physiologic limits and CT renal study with no acute findings, without any stones or evidence of hydronephrosis.  U/a showed > 100,000 colonies of Klebsiella after this visit, Rx'd Keflex.  Today, she reports the abdominal pain returned. Suprapubic discomfort with pressure-like feeling as well.  Did take full course of Keflex with resolution of abdominal pain during treatment, however returned 1 week afterwards.  Started to have burning with urination and a little hematuria just once yesterday.  Worse at the end of the day and at night, hurts to lay on her abdomen to sleep at night.  LMP 2-3 weeks ago, sexually active with her husband.  Does not use contraception.  2 previous vaginal deliveries.  No associated fever, nausea, vomiting, change in bowel movements.  PERTINENT  PMH / PSH: Depression, previous Covid illness and now fully vaccinated  OBJECTIVE:   BP 100/62   Pulse 87   Ht 5\' 3"  (1.6 m)   Wt 123 lb 3.2 oz (55.9 kg)   LMP 10/26/2019 (Approximate)   SpO2 99%   BMI 21.82 kg/m   General: Alert, NAD HEENT: NCAT, MMM Lungs:No increased WOB  Abdomen: soft, tender to palpation of suprapubic region without tenderness elsewhere, non-distended Msk: Moves all extremities spontaneously  Ext: Warm, dry, 2+ distal pulses, no edema   Pelvic exam: VULVA: normal appearing vulva with no masses, tenderness or lesions, VAGINA: normal appearing vagina with thin clear discharge, no lesions, PELVIC FLOOR EXAM: mild anterior compartment prolapse appreciated without rectocele or prolapse noted on non-dynamic visualization, CERVIX: difficulty visualizing due to  lateral opening however without any palpable polyps or nodules, UTERUS: uterus is normal size, shape, consistency and nontender, ADNEXA: normal adnexa in size, nontender and no masses.  Urine pregnancy test negative.  ASSESSMENT/PLAN:   Pelvic pain Acute, recurrent with similar episode in 09/2019 with extensive work-up during that time as above and resolved with UTI treatment.  Despite clear urinalysis, will send for culture given presentation suspicious for UTI again.  Could also consider interstitial cystitis especially with more pelvic predominant pains.  Mild anterior compartment prolapse on exam which may be contributing esp as it is worse by the end of the day.  Additionally obtained wet prep, gc/ch to r/o STD, however low concern for this.  Recommended TID kegel exercises and await urine culture.  Tylenol, ice/heat as needed.    Follow-up if not improving in the next 1-2 weeks or sooner if abdominal pain more severe, fever, or recurrent N/V/D.   10/2019, DO [facility]

## 2019-11-18 LAB — CERVICOVAGINAL ANCILLARY ONLY
Chlamydia: NEGATIVE
Comment: NEGATIVE
Comment: NEGATIVE
Comment: NORMAL
Neisseria Gonorrhea: NEGATIVE
Trichomonas: NEGATIVE

## 2019-11-19 ENCOUNTER — Encounter: Payer: Self-pay | Admitting: Family Medicine

## 2019-11-19 NOTE — Assessment & Plan Note (Addendum)
Acute, recurrent with similar episode in 09/2019 with extensive work-up during that time as above and resolved with UTI treatment.  Despite clear urinalysis, will send for culture given presentation suspicious for UTI again.  Could also consider interstitial cystitis especially with more pelvic predominant pains.  Mild anterior compartment prolapse on exam which may be contributing esp as it is worse by the end of the day.  Additionally obtained wet prep, gc/ch to r/o STD, however low concern for this.  Recommended TID kegel exercises and await urine culture.  Tylenol, ice/heat as needed.

## 2019-11-22 ENCOUNTER — Other Ambulatory Visit: Payer: Self-pay | Admitting: Family Medicine

## 2019-11-22 DIAGNOSIS — N3000 Acute cystitis without hematuria: Secondary | ICD-10-CM

## 2019-11-22 MED ORDER — NITROFURANTOIN MONOHYD MACRO 100 MG PO CAPS: 100.0000 mg | ORAL_CAPSULE | Freq: Two times a day (BID) | ORAL | 0 refills | Status: AC

## 2019-11-24 LAB — URINE CULTURE

## 2020-02-07 ENCOUNTER — Other Ambulatory Visit: Payer: Self-pay

## 2020-02-07 ENCOUNTER — Encounter (HOSPITAL_COMMUNITY): Payer: Self-pay | Admitting: Emergency Medicine

## 2020-02-07 ENCOUNTER — Ambulatory Visit (HOSPITAL_COMMUNITY)
Admission: EM | Admit: 2020-02-07 | Discharge: 2020-02-07 | Disposition: A | Discharge status: Home / Self Care | Payer: No Typology Code available for payment source | Attending: Family Medicine | Admitting: Family Medicine

## 2020-02-07 DIAGNOSIS — R1031 Right lower quadrant pain: Secondary | ICD-10-CM

## 2020-02-07 LAB — POCT URINALYSIS DIPSTICK, ED / UC
Bilirubin Urine: NEGATIVE
Glucose, UA: NEGATIVE mg/dL
Hgb urine dipstick: NEGATIVE
Ketones, ur: NEGATIVE mg/dL
Leukocytes,Ua: NEGATIVE
Nitrite: NEGATIVE
Protein, ur: NEGATIVE mg/dL
Specific Gravity, Urine: 1.015 (ref 1.005–1.030)
Urobilinogen, UA: 0.2 mg/dL (ref 0.0–1.0)
pH: 7 (ref 5.0–8.0)

## 2020-02-07 LAB — POC URINE PREG, ED: Preg Test, Ur: NEGATIVE

## 2020-02-07 NOTE — ED Triage Notes (Signed)
right lower abdominal pain present and responds well to ibuprofen. Patient says she wants to know what is causing this pain.  Patient has been to her pcp about this pain and has been to Emergency Department about this pain in the past.   Last bm was today and normal.   Slight burning with urination

## 2020-02-07 NOTE — Discharge Instructions (Addendum)
Your urine test looks normal I will send it for culture You may continue ibuprofen as needed for abdominal pain Follow-up with your primary care doctor if the pain persists I do not see any evidence of serious condition

## 2020-02-08 NOTE — ED Provider Notes (Signed)
[facility]    CSN: [id number] Arrival date & time: 02/07/20  1849      History   Chief Complaint Chief Complaint  Patient presents with  . Abdominal Pain    HPI Audrey Jensen is a 44 y.o. female.   HPI\ Patient is here for suprapubic pain.  She has had pelvic pain and lower abdominal pain off and on for years.  She is frustrated that she does not have diagnosis.  She is hoping that we can offer her diagnosis and treatment.  She has had CT scans.  Ultrasounds.  Consultations with specialist.  ER visits.  Primary care visits.  I explained to her that at an urgent care center where limited in her testing evaluation, and that I can offer her a urinalysis and a physical examination but not likely for a solution to a years old problem. No fever chills No nausea vomiting Mild dysuria No frequency, flank pain, abdominal pain.  Past Medical History:  Diagnosis Date  . Anemia   . Bell's palsy 11/02/2014  . Ingrowing toenail with infection 10/05/2015  . Skin lesion 10/04/2013  . Spontaneous abortion 12/20/2015  . Tension headache 10/06/2018  . Ulcer aphthous oral 05/13/2013  . Vulvar irritation 04/24/2015    Patient Active Problem List   Diagnosis Date Noted  . Pelvic pain 10/20/2019  . Stress 11/16/2016  . 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 No pertinent surgical history.  OB History    Gravida  4   Para  2   Term  2   Preterm  0   AB  2   Living  2     SAB  2   TAB      Ectopic  0   Multiple  0   Live Births  2        Obstetric Comments  [id number]         Home Medications    Prior to Admission medications   Medication Sig Start Date End Date Taking? Authorizing Provider  ibuprofen (ADVIL) 200 MG tablet Take 200 mg by mouth every 6 (six) hours as needed.   Yes [provider]  Multiple Vitamin (MULTIVITAMIN) tablet Take 1 tablet by mouth daily.   Yes  [provider]  cetirizine (ZYRTEC ALLERGY) 10 MG tablet Take 1 tablet (10 mg total) by mouth daily. 09/15/19 02/07/20  Wallis Bamberg, PA-C  montelukast (SINGULAIR) 10 MG tablet Take 1 tablet (10 mg total) by mouth at bedtime. 09/15/19 02/07/20  Wallis Bamberg, PA-C    Family History Family History  Problem Relation Age of Onset  . Diabetes Brother     Social History Social History   Tobacco Use  . Smoking status: Never Smoker  . Smokeless tobacco: Never Used  Vaping Use  . Vaping Use: Never used  Substance Use Topics  . Alcohol use: No  . Drug use: No     Allergies   Patient has no known allergies.   Review of Systems Review of Systems  See HPI Physical Exam Triage Vital Signs ED Triage Vitals  Enc Vitals Group     BP 02/07/20 2005 120/87     Pulse Rate 02/07/20 2005 73     Resp 02/07/20 2005 18     Temp 02/07/20 2005 97.9 F (36.6 C)     Temp Source 02/07/20 2005 Oral     SpO2 02/07/20 2005 100 %  Weight --      Height --      Head Circumference --      Peak Flow --      Pain Score 02/07/20 2001 7     Pain Loc --      Pain Edu? --      Excl. in GC? --    No data found.  Updated Vital Signs BP 120/87 (BP Location: Right Arm)   Pulse 73   Temp 97.9 F (36.6 C) (Oral)   Resp 18   LMP 01/31/2020   SpO2 100%      Physical Exam Constitutional:      General: She is not in acute distress.    Appearance: She is well-developed.  HENT:     Head: Normocephalic and atraumatic.  Eyes:     Conjunctiva/sclera: Conjunctivae normal.     Pupils: Pupils are equal, round, and reactive to light.  Cardiovascular:     Rate and Rhythm: Normal rate.     Heart sounds: Normal heart sounds.  Pulmonary:     Effort: Pulmonary effort is normal. No respiratory distress.     Breath sounds: Normal breath sounds.  Abdominal:     General: Abdomen is flat. Bowel sounds are normal. There is no distension.     Palpations: Abdomen is soft. There is no hepatomegaly or  splenomegaly.     Tenderness: There is no abdominal tenderness.  Musculoskeletal:        General: Normal range of motion.     Cervical back: Normal range of motion.  Skin:    General: Skin is warm and dry.  Neurological:     Mental Status: She is alert.      UC Treatments / Results  Labs (all labs ordered are listed, but only abnormal results are displayed) Labs Reviewed  URINE CULTURE  POCT URINALYSIS DIPSTICK, ED / UC  POC URINE PREG, ED  Urinalysis is negative Urine culture is sent at patient request Urine pregnancy is negative  EKG   Radiology No results found.  Procedures Procedures (including critical care time)  Medications Ordered in UC Medications - No data to display  Initial Impression / Assessment and Plan / UC Course  I have reviewed the triage vital signs and the nursing notes.  Pertinent labs & imaging results that were available during my care of the patient were reviewed by me and considered in my medical decision making (see chart for details).     Patient has a soft and benign abdominal exam.  She has a normal urinalysis.  I told her I think the likelihood that she has any significant pathology is low.  I encouraged her to continue ibuprofen.  Follow-up with her primary care doctor if symptoms persist.  We will send antibiotics if urine culture is positive Final Clinical Impressions(s) / UC Diagnoses   Final diagnoses:  Right lower quadrant abdominal pain     Discharge Instructions     Your urine test looks normal I will send it for culture You may continue ibuprofen as needed for abdominal pain Follow-up with your primary care doctor if the pain persists I do not see any evidence of serious condition    ED Prescriptions    None     PDMP not reviewed this encounter.   Eustace Moore, MD 02/08/20 [phone]

## 2020-02-10 ENCOUNTER — Telehealth (HOSPITAL_COMMUNITY): Payer: Self-pay | Admitting: Emergency Medicine

## 2020-02-10 LAB — URINE CULTURE: Culture: 30000 — AB

## 2020-02-10 MED ORDER — NITROFURANTOIN MONOHYD MACRO 100 MG PO CAPS: 100.0000 mg | ORAL_CAPSULE | Freq: Two times a day (BID) | ORAL | 0 refills | Status: DC

## 2020-09-17 ENCOUNTER — Encounter (HOSPITAL_COMMUNITY): Payer: Self-pay

## 2020-09-17 ENCOUNTER — Ambulatory Visit (HOSPITAL_COMMUNITY)
Admission: EM | Admit: 2020-09-17 | Discharge: 2020-09-17 | Disposition: A | Discharge status: Home / Self Care | Payer: No Typology Code available for payment source | Attending: Emergency Medicine | Admitting: Emergency Medicine

## 2020-09-17 DIAGNOSIS — N898 Other specified noninflammatory disorders of vagina: Secondary | ICD-10-CM | POA: Insufficient documentation

## 2020-09-17 LAB — POCT URINALYSIS DIPSTICK, ED / UC
Bilirubin Urine: NEGATIVE
Glucose, UA: NEGATIVE mg/dL
Hgb urine dipstick: NEGATIVE
Ketones, ur: NEGATIVE mg/dL
Nitrite: NEGATIVE
Protein, ur: NEGATIVE mg/dL
Specific Gravity, Urine: 1.025 (ref 1.005–1.030)
Urobilinogen, UA: 0.2 mg/dL (ref 0.0–1.0)
pH: 7 (ref 5.0–8.0)

## 2020-09-17 LAB — POC URINE PREG, ED: Preg Test, Ur: NEGATIVE

## 2020-09-17 NOTE — ED Triage Notes (Signed)
Pt repost burning when urinating x 4 days; vaginal itchy x 2 days. Pt reports she used Vagisil soap and 1 vaginal ovule yesterday. Denies abdominal pain.

## 2020-09-17 NOTE — ED Provider Notes (Cosign Needed)
[facility]  ____________________________________________  Time seen: Approximately 12:40 PM  I have reviewed the triage vital signs and the nursing notes.   HISTORY  Chief Complaint Dysuria   Historian Patient     HPI Audrey Jensen is a 45 y.o. female presents to the urgent care with vaginal itching that has occurred for the past 2 to 3 days.  Patient denies changes in vaginal discharge.  She reports that she has had some dysuria.  No increased urinary frequency, low back pain, nausea or vomiting.  She reports that she has had similar symptoms in the past.  She has been using Vagisil at home.   Past Medical History:  Diagnosis Date   Anemia    Bell's palsy 11/02/2014   Ingrowing toenail with infection 10/05/2015   Skin lesion 10/04/2013   Spontaneous abortion 12/20/2015   Tension headache 10/06/2018   Ulcer aphthous oral 05/13/2013   Vulvar irritation 04/24/2015     Immunizations up to date:  Yes.     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 No pertinent surgical history.  Prior to Admission medications   Medication Sig Start Date End Date Taking? Authorizing Provider  ibuprofen (ADVIL) 200 MG tablet Take 200 mg by mouth every 6 (six) hours as needed.    [provider]  Multiple Vitamin (MULTIVITAMIN) tablet Take 1 tablet by mouth daily.    [provider]  nitrofurantoin, macrocrystal-monohydrate, (MACROBID) 100 MG capsule Take 1 capsule (100 mg total) by mouth 2 (two) times daily. 02/10/20    Merrilee Jansky, MD  cetirizine (ZYRTEC ALLERGY) 10 MG tablet Take 1 tablet (10 mg total) by mouth daily. 09/15/19 02/07/20  Wallis Bamberg, PA-C  montelukast (SINGULAIR) 10 MG tablet Take 1 tablet (10 mg total) by mouth at bedtime. 09/15/19 02/07/20  Wallis Bamberg, PA-C    Allergies Patient has no known allergies.  Family History  Problem Relation Age of Onset   Healthy Mother    Healthy Father    Diabetes Brother     Social History Social History   Tobacco Use   Smoking status: Never   Smokeless tobacco: Never  Vaping Use   Vaping Use: Never used  Substance Use Topics   Alcohol use: No   Drug use: No     Review of Systems  Constitutional: No fever/chills Eyes:  No discharge ENT: No upper respiratory complaints. Respiratory: no cough. No SOB/ use of accessory muscles to breath Gastrointestinal:   No nausea, no vomiting.  No diarrhea.  No constipation. Genitourinary: Patient has vaginal itching.  Musculoskeletal: Negative for musculoskeletal pain. Skin: Negative for rash, abrasions, lacerations, ecchymosis.  ____________________________________________   PHYSICAL EXAM:  VITAL SIGNS: ED Triage Vitals  Enc Vitals Group     BP 09/17/20 1218 119/84     Pulse Rate 09/17/20 1218 74     Resp 09/17/20 1218 15     Temp 09/17/20 1218 97.7 F (36.5 C)     Temp Source 09/17/20 1218 Oral     SpO2 09/17/20 1218  100 %     Weight --      Height --      Head Circumference --      Peak Flow --      Pain Score 09/17/20 1217 0     Pain Loc --      Pain Edu? --      Excl. in GC? --      Constitutional: Alert and oriented. Well appearing and in no acute distress. Eyes: Conjunctivae are normal. PERRL. EOMI. Head: Atraumatic. ENT: Cardiovascular: Normal rate, regular rhythm. Normal S1 and S2.  Good peripheral circulation. Respiratory: Normal respiratory effort without tachypnea or retractions. Lungs CTAB. Good air entry to the bases with no decreased or absent breath  sounds Gastrointestinal: Bowel sounds x 4 quadrants. Soft and nontender to palpation. No guarding or rigidity. No distention. Musculoskeletal: Full range of motion to all extremities. No obvious deformities noted Neurologic:  Normal for age. No gross focal neurologic deficits are appreciated.  Skin:  Skin is warm, dry and intact. No rash noted. Psychiatric: Mood and affect are normal for age. Speech and behavior are normal.   ____________________________________________   LABS (all labs ordered are listed, but only abnormal results are displayed)  Labs Reviewed  POCT URINALYSIS DIPSTICK, ED / UC - Abnormal; Notable for the following components:      Result Value   Leukocytes,Ua TRACE (*)    All other components within normal limits  URINE CULTURE  POC URINE PREG, ED  POC URINE PREG, ED  CERVICOVAGINAL ANCILLARY ONLY   ____________________________________________  EKG   ____________________________________________  RADIOLOGY   No results found.  ____________________________________________    PROCEDURES  Procedure(s) performed:     Procedures     Medications - No data to display   ____________________________________________   INITIAL IMPRESSION / ASSESSMENT AND PLAN / ED COURSE  Pertinent labs & imaging results that were available during my care of the patient were reviewed by me and considered in my medical decision making (see chart for details).      Assessment and plan Vaginal pruritus 45 year old female presents to the urgent care for vaginal pruritus area for the past 2 to 3 days.  Vital signs are reassuring at triage.  On physical exam, patient was alert, active and nontoxic-appearing.  Wet prep is in process at this time.  Explained to patient that we would change her management plan depending on wet prep results.  Urinalysis shows no signs of UTI.  Urine pregnancy testing was  negative.     ____________________________________________  FINAL CLINICAL IMPRESSION(S) / ED DIAGNOSES  Final diagnoses:  Vaginal pruritus      NEW MEDICATIONS STARTED DURING THIS VISIT:  ED Discharge Orders     None           This chart was dictated using voice recognition software/Dragon. Despite best efforts to proofread, errors can occur which can change the meaning. Any change was purely unintentional.     Orvil Feil, PA-C 09/17/20 1737

## 2020-09-17 NOTE — Discharge Instructions (Addendum)
Your wet prep will post to MyChart We will start a management plan depending on those results.

## 2020-09-18 LAB — URINE CULTURE: Culture: <10000 — AB

## 2020-09-19 ENCOUNTER — Telehealth (HOSPITAL_COMMUNITY): Payer: Self-pay | Admitting: Emergency Medicine

## 2020-09-19 ENCOUNTER — Other Ambulatory Visit (HOSPITAL_COMMUNITY): Payer: Self-pay

## 2020-09-19 LAB — CERVICOVAGINAL ANCILLARY ONLY
Bacterial Vaginitis (gardnerella): NEGATIVE
Candida Glabrata: NEGATIVE
Candida Vaginitis: POSITIVE — AB
Chlamydia: NEGATIVE
Comment: NEGATIVE
Comment: NEGATIVE
Comment: NEGATIVE
Comment: NEGATIVE
Comment: NEGATIVE
Comment: NORMAL
Neisseria Gonorrhea: NEGATIVE
Trichomonas: NEGATIVE

## 2020-09-19 MED ORDER — FLUCONAZOLE 150 MG PO TABS
150.0000 mg | ORAL_TABLET | Freq: Once | ORAL | 0 refills | Status: AC
Start: 2020-09-19 — End: 2020-09-20
  Filled 2020-09-19: qty 1, 1d supply, fill #0

## 2020-09-20 ENCOUNTER — Other Ambulatory Visit (HOSPITAL_COMMUNITY): Payer: Self-pay

## 2020-09-22 ENCOUNTER — Telehealth (HOSPITAL_COMMUNITY): Payer: Self-pay | Admitting: Emergency Medicine

## 2020-09-22 ENCOUNTER — Other Ambulatory Visit (HOSPITAL_COMMUNITY): Payer: Self-pay

## 2020-09-22 MED ORDER — FLUCONAZOLE 150 MG PO TABS
150.0000 mg | ORAL_TABLET | Freq: Once | ORAL | 0 refills | Status: AC
  Filled 2020-09-22: qty 2, 2d supply, fill #0

## 2020-11-06 ENCOUNTER — Other Ambulatory Visit: Payer: Self-pay | Admitting: Obstetrics and Gynecology

## 2020-11-06 DIAGNOSIS — Z1231 Encounter for screening mammogram for malignant neoplasm of breast: Secondary | ICD-10-CM

## 2020-11-30 ENCOUNTER — Other Ambulatory Visit: Payer: Self-pay | Admitting: Obstetrics and Gynecology

## 2020-11-30 DIAGNOSIS — Z1231 Encounter for screening mammogram for malignant neoplasm of breast: Secondary | ICD-10-CM

## 2020-12-19 ENCOUNTER — Ambulatory Visit: Payer: No Typology Code available for payment source

## 2021-01-04 ENCOUNTER — Ambulatory Visit
Admission: RE | Admit: 2021-01-04 | Discharge: 2021-01-04 | Disposition: A | Discharge status: Home / Self Care | Payer: No Typology Code available for payment source | Source: Ambulatory Visit | Attending: Obstetrics and Gynecology | Admitting: Obstetrics and Gynecology

## 2021-01-04 ENCOUNTER — Other Ambulatory Visit: Payer: Self-pay

## 2021-01-04 ENCOUNTER — Ambulatory Visit: Payer: Self-pay | Admitting: *Deleted

## 2021-01-04 VITALS — BP 114/74 | Wt 125.3 lb

## 2021-01-04 DIAGNOSIS — Z1211 Encounter for screening for malignant neoplasm of colon: Secondary | ICD-10-CM

## 2021-01-04 DIAGNOSIS — Z1239 Encounter for other screening for malignant neoplasm of breast: Secondary | ICD-10-CM

## 2021-01-04 DIAGNOSIS — Z1231 Encounter for screening mammogram for malignant neoplasm of breast: Secondary | ICD-10-CM

## 2021-01-04 NOTE — Progress Notes (Signed)
[name] is a 45 y.o. female who presents to [facility] clinic today with no complaints.    Pap Smear: Pap smear not completed today. Last Pap smear was 01/07/2019 at [facility] clinic and was normal with negative HPV. Per patient has no history of an abnormal Pap smear. Last Pap smear result is available in Epic.   Physical exam: Breasts Breasts symmetrical. No skin abnormalities bilateral breasts. No nipple retraction bilateral breasts. No nipple discharge bilateral breasts. No lymphadenopathy. No lumps palpated bilateral breasts. No complaints of pain or tenderness on exam.  MS DIGITAL SCREENING TOMO BILATERAL  Result Date: 10/28/2019 CLINICAL DATA:  Screening. EXAM: DIGITAL SCREENING BILATERAL MAMMOGRAM WITH TOMO AND CAD COMPARISON:  Previous exam(s). ACR Breast Density Category b: There are scattered areas of fibroglandular density. FINDINGS: There are no findings suspicious for malignancy. Images were processed with CAD. IMPRESSION: No mammographic evidence of malignancy. A result letter of this screening mammogram will be mailed directly to the patient. RECOMMENDATION: Screening mammogram in one year. (Code:SM-B-01Y) BI-RADS CATEGORY  1: Negative. Electronically Signed   By: Hulan Saas M.D.   On: 10/28/2019 08:40   MS DIGITAL SCREENING TOMO BILATERAL  Addendum Date: 06/05/2018   ADDENDUM REPORT: 06/05/2018 11:23 ADDENDUM: An addendum is made to the report due to a typo. The corrections are as below: Comparison: none Breast density: b: There are scattered areas of fibroglandular density. The remainder of the report is correct. Electronically Signed   By: Edwin Cap M.D.   On: 06/05/2018 11:23   Result Date: 06/05/2018 CLINICAL DATA:  Screening. EXAM: DIGITAL SCREENING BILATERAL MAMMOGRAM WITH TOMO AND CAD COMPARISON:  Previous exam(s). ACR Breast Density Category none FINDINGS: In the left breast, a possible mass warrants further evaluation. In the right  breast, no findings suspicious for malignancy. Images were processed with CAD. IMPRESSION: Further evaluation is suggested for possible mass in the left breast. RECOMMENDATION: Diagnostic mammogram and possibly ultrasound of the left breast. (Code:FI-L-33M) The patient will be contacted regarding the findings, and additional imaging will be scheduled. BI-RADS CATEGORY  0: Incomplete. Need additional imaging evaluation and/or prior mammograms for comparison. Electronically Signed: By: Edwin Cap M.D. On: 06/05/2018 10:51   MS DIGITAL DIAG TOMO UNI LEFT  Result Date: 06/18/2018 CLINICAL DATA:  45 year old female recalled from baseline screening mammogram dated 06/04/2018 for a possible left breast mass. EXAM: DIGITAL DIAGNOSTIC UNILATERAL LEFT MAMMOGRAM WITH CAD AND TOMO COMPARISON:  Previous exam(s). ACR Breast Density Category c: The breast tissue is heterogeneously dense, which may obscure small masses. FINDINGS: Previously described, possible mass in the central medial left breast at mid to posterior depth resolves into well dispersed fibroglandular tissue on today's additional views. No suspicious findings are identified. Mammographic images were processed with CAD. IMPRESSION: No mammographic evidence of malignancy. RECOMMENDATION: Screening mammogram in one year.(Code:SM-B-01Y) I have discussed the findings and recommendations with the patient. Results were also provided in writing at the conclusion of the visit. If applicable, a reminder letter will be sent to the patient regarding the next appointment. BI-RADS CATEGORY  1: Negative. Electronically Signed   By: Sande Brothers M.D.   On: 06/18/2018 09:55         Pelvic/Bimanual Pap is not indicated today per BCCCP guidelines.   Smoking History: Patient has never smoked.   Patient Navigation: Patient education provided. Access to services provided for patient through [place] program. Spanish  interpreter [name] from [facility] provided.    Colorectal Cancer Screening: Per  patient has never had colonoscopy completed. FIT Test given to patient to complete. No complaints today.    Breast and Cervical Cancer Risk Assessment: Patient does not have family history of breast cancer, known genetic mutations, or radiation treatment to the chest before age 22. Patient does not have history of cervical dysplasia, immunocompromised, or DES exposure in-utero.  Risk Assessment     Risk Scores       01/04/2021 10/26/2019   Last edited by: Meryl Dare, CMA Meryl Dare, CMA   5-year risk: 0.5 % 0.4 %   Lifetime risk: 5.6 % 5.6 %            A: BCCCP exam without pap smear No complaints.  P: Referred patient to the [facility] for a screening mammogram on mobile unit. Appointment scheduled Thursday, January 04, 2021 at 1540.  Priscille Heidelberg, RN 01/04/2021 3:23 PM

## 2021-01-04 NOTE — Patient Instructions (Signed)
Explained breast self awareness with [name]. Patient did not need a Pap smear today due to last Pap smear and HPV typing was 01/07/2019. Let her know BCCCP will cover Pap smears and HPV typing every 5 years unless has a history of abnormal Pap smears. Referred patient to the [facility] for a screening mammogram on mobile unit. Appointment scheduled Thursday, January 04, 2021 at 1540. Patient escorted to the mobile unit following BCCCP appointment for her screening mammogram. Let patient know the Breast Center will follow up with her within the next couple weeks with results of mammogram by letter or phone. Audrey Jensen verbalized understanding.  Audrey Jensen, Audrey Maser, RN 3:23 PM

## 2021-02-22 LAB — FECAL OCCULT BLOOD, IMMUNOCHEMICAL: Fecal Occult Bld: NEGATIVE

## 2021-02-22 LAB — SPECIMEN STATUS REPORT

## 2021-02-26 ENCOUNTER — Telehealth: Payer: Self-pay

## 2021-02-26 NOTE — Telephone Encounter (Signed)
Called patient via Audrey Jensen, [employer] to give FIT test results. Informed patient that FIT test was Normal. Patient voiced understanding. 

## 2021-07-23 ENCOUNTER — Ambulatory Visit (HOSPITAL_COMMUNITY)
Admission: EM | Admit: 2021-07-23 | Discharge: 2021-07-23 | Disposition: A | Discharge status: Home / Self Care | Payer: No Typology Code available for payment source | Attending: Emergency Medicine | Admitting: Emergency Medicine

## 2021-07-23 ENCOUNTER — Encounter (HOSPITAL_COMMUNITY): Payer: Self-pay

## 2021-07-23 DIAGNOSIS — R051 Acute cough: Secondary | ICD-10-CM

## 2021-07-23 MED ORDER — PROMETHAZINE-DM 6.25-15 MG/5ML PO SYRP: 5.0000 mL | ORAL_SOLUTION | Freq: Four times a day (QID) | ORAL | 0 refills | Status: DC | PRN

## 2021-07-23 MED ORDER — PREDNISONE 20 MG PO TABS: 40.0000 mg | ORAL_TABLET | Freq: Every day | ORAL | 0 refills | Status: DC

## 2021-07-23 MED ORDER — BENZONATATE 100 MG PO CAPS: 100.0000 mg | ORAL_CAPSULE | Freq: Three times a day (TID) | ORAL | 0 refills | Status: DC

## 2021-07-23 MED ORDER — FLUTICASONE PROPIONATE 50 MCG/ACT NA SUSP: 1.0000 | Freq: Every day | NASAL | 0 refills | Status: DC

## 2021-07-23 NOTE — ED Triage Notes (Signed)
Pt presents today with cough and congestion that has been going on for a month. Pt state no medication has helped with her cough. Pt states her menstrual cycle has not come down in two months. ?

## 2021-07-23 NOTE — Discharge Instructions (Addendum)
Today you are being treated for inflammation to your upper airways ? ?Begin use of prednisone every morning with food for 5 days to help reduce inflammation  ? ?May use Tessalon pill every 8 hours as needed to help calm coughing ? ?May use promethazine DM for coughing and additional comfort, be mindful this medication may make you drowsy ? ?Continue taking allergy medicine and Mucinex daily before bed to help reduce morning symptoms ? ?Begin use of Flonase every morning to help clear congestion from the sinus cavity ? ?For worsening signs of breathing please go to the nearest emergency department for evaluation ? ?In addition: ? ?Maintaining adequate hydration may help to thin secretions and soothe the respiratory mucosa  ? ?Warm Liquids- Ingestion of warm liquids may have a soothing effect on the respiratory mucosa, increase the flow of nasal mucus, and loosen respiratory secretions, making them easier to remove ? ?May try honey (2.5 to 5 mL [0.5 to 1 teaspoon]) can be given straight or diluted in liquid (juice). Corn syrup may be substituted if honey is not available.    ? ? ?Your missed period.  This may be a result of early menopause versus stress, watch closely,  menopause is considered 12 months consecutively without a period,   ?

## 2021-07-24 NOTE — ED Provider Notes (Signed)
?[facility] ? ? ? ?CSN: [id number] ?Arrival date & time: 07/23/21  1721 ? ? ?  ? ?History   ?Chief Complaint ?No chief complaint on file. ? ? ?HPI ?Audrey Jensen is a 46 y.o. female.  ? ?Patient presents with nasal congestion,nonproductive cough and chest soreness persisting for 1 month initially beginning after a viral upper respiratory infection.  Dors is that symptoms are worse first thing in the morning, feels as if there is a lump of mucus sitting at her throat.  She is unable to move around.  Cough sometimes interferes with sleeping . tolerating food and liquids.  Has attempted use of over-the-counter medications which have been ineffective.  Denies shortness of breath, wheezing, chest pain or tightness. ? ?Declined use of interpreter ? ?Past Medical History:  ?Diagnosis Date  ? Anemia   ? Bell's palsy 11/02/2014  ? Ingrowing toenail with infection 10/05/2015  ? Skin lesion 10/04/2013  ? Spontaneous abortion 12/20/2015  ? Tension headache 10/06/2018  ? Ulcer aphthous oral 05/13/2013  ? Vulvar irritation 04/24/2015  ? ? ?Patient Active Problem List  ? Diagnosis Date Noted  ? Pelvic pain 10/20/2019  ? Stress 11/16/2016  ? Presbyopia 11/16/2016  ? 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 ? ?History reviewed. No pertinent surgical history. ? ?OB History   ? ? Gravida  ?4  ? Para  ?2  ? Term  ?2  ? Preterm  ?0  ? AB  ?2  ? Living  ?2  ?  ? ? SAB  ?2  ? IAB  ?   ? Ectopic  ?0  ? Multiple  ?0  ? Live Births  ?2  ?   ?  ? Obstetric Comments  ?[id number]  ?  ? ?  ? ? ? ?Home Medications   ? ?Prior to Admission medications   ?Medication Sig Start Date End Date Taking? Authorizing Provider  ?benzonatate (TESSALON) 100 MG capsule Take 1 capsule (100 mg total) by mouth every 8 (eight) hours. 07/23/21  Yes Westin Knotts R, NP  ?fluticasone (FLONASE) 50 MCG/ACT nasal spray Place 1 spray into both nostrils daily. 07/23/21  Yes [place], Elita Boone, NP  ?predniSONE (DELTASONE) 20 MG tablet Take 2  tablets (40 mg total) by mouth daily. 07/23/21  Yes Lus Kriegel, Elita Boone, NP  ?promethazine-dextromethorphan (PROMETHAZINE-DM) 6.25-15 MG/5ML syrup Take 5 mLs by mouth 4 (four) times daily as needed for cough. 07/23/21  Yes Valinda Hoar, NP  ?Acetaminophen-Caff-Pyrilamine (MIDOL COMPLETE PO) Take by mouth.    [provider]  ?Ascorbic Acid (VITAMIN C) 100 MG tablet Take 100 mg by mouth daily.    [provider]  ?ibuprofen (ADVIL) 200 MG tablet Take 200 mg by mouth every 6 (six) hours as needed. ?Patient not taking: Reported on 01/04/2021    [provider]  ?Multiple Vitamin (MULTIVITAMIN) tablet Take 1 tablet by mouth daily.    [provider]  ?nitrofurantoin, macrocrystal-monohydrate, (MACROBID) 100 MG capsule Take 1 capsule (100 mg total) by mouth 2 (two) times daily. ?Patient not taking: Reported on 01/04/2021 02/10/20   Merrilee Jansky, MD  ?UNKNOWN TO PATIENT Patient states that she takes a supplement for joint point but is unsure of the name.    [provider]  ?cetirizine (ZYRTEC ALLERGY) 10 MG tablet Take 1 tablet (10 mg total) by mouth daily. 09/15/19 02/07/20  Wallis Bamberg, PA-C  ?montelukast (SINGULAIR) 10 MG tablet Take 1 tablet (10 mg total) by mouth at  bedtime. 09/15/19 02/07/20  Wallis BambergMani, Mario, PA-C  ? ? ?Family History ?Family History  ?Problem Relation Age of Onset  ? Healthy Mother   ? Healthy Father   ? Diabetes Brother   ? ? ?Social History ?Social History  ? ?Tobacco Use  ? Smoking status: Never  ? Smokeless tobacco: Never  ?Vaping Use  ? Vaping Use: Never used  ?Substance Use Topics  ? Alcohol use: No  ? Drug use: No  ? ? ? ?Allergies   ?Latex ? ? ?Review of Systems ?Review of Systems  ?Constitutional: Negative.   ?HENT:  Positive for congestion. Negative for dental problem, drooling, ear discharge, ear pain, facial swelling, hearing loss, mouth sores, nosebleeds, postnasal drip, rhinorrhea, sinus pressure, sinus pain, sneezing, sore throat, tinnitus,  trouble swallowing and voice change.   ?Respiratory:  Positive for cough. Negative for apnea, choking, chest tightness, shortness of breath, wheezing and stridor.   ?Cardiovascular: Negative.   ?Skin: Negative.   ?Neurological: Negative.   ? ? ?Physical Exam ?Triage Vital Signs ?ED Triage Vitals  ?Enc Vitals Group  ?   BP 07/23/21 1817 113/81  ?   Pulse Rate 07/23/21 1817 87  ?   Resp 07/23/21 1817 18  ?   Temp 07/23/21 1817 98.2 ?F (36.8 ?C)  ?   Temp Source 07/23/21 1817 Oral  ?   SpO2 07/23/21 1817 99 %  ?   Weight --   ?   Height --   ?   Head Circumference --   ?   Peak Flow --   ?   Pain Score 07/23/21 1813 8  ?   Pain Loc --   ?   Pain Edu? --   ?   Excl. in GC? --   ? ?No data found. ? ?Updated Vital Signs ?BP 113/81 (BP Location: Left Arm)   Pulse 87   Temp 98.2 ?F (36.8 ?C) (Oral)   Resp 18   LMP  (LMP Unknown)   SpO2 99%  ? ?Visual Acuity ?Right Eye Distance:   ?Left Eye Distance:   ?Bilateral Distance:   ? ?Right Eye Near:   ?Left Eye Near:    ?Bilateral Near:    ? ?Physical Exam ?Constitutional:   ?   Appearance: Normal appearance.  ?HENT:  ?   Head: Normocephalic.  ?   Right Ear: Tympanic membrane, ear canal and external ear normal.  ?   Left Ear: Tympanic membrane, ear canal and external ear normal.  ?   Nose: Congestion present. No rhinorrhea.  ?   Mouth/Throat:  ?   Mouth: Mucous membranes are moist.  ?   Pharynx: Oropharynx is clear.  ?Eyes:  ?   Extraocular Movements: Extraocular movements intact.  ?Cardiovascular:  ?   Rate and Rhythm: Normal rate and regular rhythm.  ?   Pulses: Normal pulses.  ?   Heart sounds: Normal heart sounds.  ?Pulmonary:  ?   Effort: Pulmonary effort is normal.  ?   Breath sounds: Normal breath sounds.  ?   Comments: Dry cough witnessed ?Skin: ?   General: Skin is warm and dry.  ?Neurological:  ?   Mental Status: She is alert and oriented to person, place, and time. Mental status is at baseline.  ?Psychiatric:     ?   Mood and Affect: Mood normal.     ?   Behavior:  Behavior normal.  ? ? ? ?UC Treatments / Results  ?Labs ?(all labs ordered are listed,  but only abnormal results are displayed) ?Labs Reviewed - No data to display ? ?EKG ? ? ?Radiology ?No results found. ? ?Procedures ?Procedures (including critical care time) ? ?Medications Ordered in UC ?Medications - No data to display ? ?Initial Impression / Assessment and Plan / UC Course  ?I have reviewed the triage vital signs and the nursing notes. ? ?Pertinent labs & imaging results that were available during my care of the patient were reviewed by me and considered in my medical decision making (see chart for details). ? ?Acute cough ? ?Vital signs are stable, O2 saturation 99% on room air, lungs are clear to auscultation, will defer imaging at this time, due to timeline of illness low suspicion of symptoms still be related to an infectious cause most likely a postviral cough that has persisted, discussed etiology with patient, prescribed prednisone 40 mg burst, Tessalon, Promethazine DM and Flonase for management of cough and congestion, discussed administration, advised increasing fluid intake to keep secretions thin and use of humidifier or steam shower at bedtime for additional support, may follow-up with urgent care as needed for persisting or worsening symptoms ?Final Clinical Impressions(s) / UC Diagnoses  ? ?Final diagnoses:  ?Acute cough  ? ? ? ?Discharge Instructions   ? ?  ?Today you are being treated for inflammation to your upper airways ? ?Begin use of prednisone every morning with food for 5 days to help reduce inflammation  ? ?May use Tessalon pill every 8 hours as needed to help calm coughing ? ?May use promethazine DM for coughing and additional comfort, be mindful this medication may make you drowsy ? ?Continue taking allergy medicine and Mucinex daily before bed to help reduce morning symptoms ? ?Begin use of Flonase every morning to help clear congestion from the sinus cavity ? ?For worsening signs of  breathing please go to the nearest emergency department for evaluation ? ?In addition: ? ?Maintaining adequate hydration may help to thin secretions and soothe the respiratory mucosa  ? ?Warm Liquids- Ingestion

## 2021-09-04 ENCOUNTER — Ambulatory Visit (INDEPENDENT_AMBULATORY_CARE_PROVIDER_SITE_OTHER): Payer: Self-pay | Admitting: Family Medicine

## 2021-09-04 ENCOUNTER — Other Ambulatory Visit (HOSPITAL_COMMUNITY)
Admission: RE | Admit: 2021-09-04 | Discharge: 2021-09-04 | Disposition: A | Discharge status: Home / Self Care | Payer: Self-pay | Source: Ambulatory Visit | Attending: Family Medicine | Admitting: Family Medicine

## 2021-09-04 ENCOUNTER — Other Ambulatory Visit (HOSPITAL_COMMUNITY): Payer: Self-pay

## 2021-09-04 VITALS — BP 113/62 | HR 85 | Ht 63.0 in | Wt 130.0 lb

## 2021-09-04 DIAGNOSIS — N898 Other specified noninflammatory disorders of vagina: Secondary | ICD-10-CM

## 2021-09-04 DIAGNOSIS — B379 Candidiasis, unspecified: Secondary | ICD-10-CM

## 2021-09-04 LAB — POCT URINALYSIS DIP (MANUAL ENTRY)
Bilirubin, UA: NEGATIVE
Blood, UA: NEGATIVE
Glucose, UA: NEGATIVE mg/dL
Ketones, POC UA: NEGATIVE mg/dL
Leukocytes, UA: NEGATIVE
Nitrite, UA: NEGATIVE
Protein Ur, POC: NEGATIVE mg/dL
Spec Grav, UA: 1.025 (ref 1.010–1.025)
Urobilinogen, UA: 0.2 E.U./dL
pH, UA: 6 (ref 5.0–8.0)

## 2021-09-04 LAB — POCT WET PREP (WET MOUNT)
Clue Cells Wet Prep Whiff POC: NEGATIVE
Trichomonas Wet Prep HPF POC: ABSENT
WBC, Wet Prep HPF POC: >20

## 2021-09-04 MED ORDER — FLUCONAZOLE 150 MG PO TABS
150.0000 mg | ORAL_TABLET | Freq: Once | ORAL | 0 refills | Status: AC
  Filled 2021-09-04: qty 1, 1d supply, fill #0

## 2021-09-04 NOTE — Patient Instructions (Signed)
It was wonderful to see you today.  Please bring ALL of your medications with you to every visit.   Today we talked about:  -I will contact you when results are available - We will give you a call to get you scheduled in our dermatology clinic  If you haven't already, sign up for My Chart to have easy access to your labs results, and communication with your primary care physician.  Please call the clinic at [phone] if your symptoms worsen or you have any concerns. It was our pleasure to serve you.  Dr. Salvadore Dom

## 2021-09-04 NOTE — Progress Notes (Unsigned)
    SUBJECTIVE:   CHIEF COMPLAINT / HPI:   Dysuria Experiencing vaginal irritation and burning when urinating. States she is allergic to latex and may have recently used a condom with latex. She is in a monogamous relationship with her husband but agrees to STD testing.   PERTINENT  PMH / PSH: As above.   OBJECTIVE:   BP 113/62   Pulse 85   Ht 5\' 3"  (1.6 m)   Wt 130 lb (59 kg)   LMP 08/16/2021 (Approximate)   SpO2 100%   BMI 23.03 kg/m   General: Appears well, no acute distress. Age appropriate. Respiratory: normal effort Pelvic exam: normal external genitalia, vulva, vagina, cervix, uterus and adnexa. Chaperone present for exam. April CMA Skin: Raised erythematous patches on upper eyelids bilaterally Neuro: alert and oriented Psych: normal affect  ASSESSMENT/PLAN:   1. Vaginal itching X1 week. Evidence of yeast infection on wet prep. Will follow up additional STD testing.  - POCT urinalysis dipstick - Cervicovaginal ancillary only - POCT Wet Prep (Wet [place]) - HIV antibody (with reflex) - Hepatitis C antibody (reflex, frozen specimen) - RPR  2. Yeast infection - fluconazole (DIFLUCAN) 150 MG tablet; Take 1 tablet (150 mg total) by mouth once for 1 dose.  Dispense: 1 tablet; Refill: 0  Of note patient has an eyelid rash of x4 weeks; eczema v. blepharitis. Will likely benefit from [facility] dermatology clinic OV. Will reach out to clinic for permission to schedule.   [facility], DO [facility]

## 2021-09-05 LAB — HCV AB W REFLEX TO QUANT PCR: HCV Ab: NONREACTIVE

## 2021-09-05 LAB — CERVICOVAGINAL ANCILLARY ONLY
Chlamydia: NEGATIVE
Comment: NEGATIVE
Comment: NORMAL
Neisseria Gonorrhea: NEGATIVE

## 2021-09-05 LAB — HIV ANTIBODY (ROUTINE TESTING W REFLEX): HIV Screen 4th Generation wRfx: NONREACTIVE

## 2021-09-05 LAB — RPR: RPR Ser Ql: NONREACTIVE

## 2021-09-05 LAB — HCV INTERPRETATION

## 2021-09-07 ENCOUNTER — Telehealth: Payer: Self-pay

## 2021-09-07 MED ORDER — FLUCONAZOLE 150 MG PO TABS: 150.0000 mg | ORAL_TABLET | Freq: Once | ORAL | 0 refills | Status: AC

## 2021-09-07 NOTE — Telephone Encounter (Signed)
Rx to pharmacy. Patient made aware.

## 2021-09-07 NOTE — Telephone Encounter (Signed)
Patient calls nurse line reporting continued vaginal itching.   Patient reports she was given #1 diflucan on 6/6. Patient reports her symptoms did improve, however she is still having some itchy discharge. No new symptoms.   Patient is requesting an additional pill.   Will forward to provider who saw patient.   Please send to [facility] on [address].

## 2021-09-07 NOTE — Addendum Note (Signed)
Addended by: Caralee Ates on: 09/07/2021 05:11 PM   Modules accepted: Orders

## 2021-09-20 ENCOUNTER — Ambulatory Visit: Payer: No Typology Code available for payment source

## 2021-09-20 NOTE — Progress Notes (Deleted)
  SUBJECTIVE:   CHIEF COMPLAINT / HPI:    RASH Had rash for ***  Location: *** Medications tried: *** Patient believes may be caused by ***  New medications or antibiotics: *** Tick, Insect or new pet exposure: *** Recent travel: *** New detergent or soap: *** Immunocompromised: ***  Symptoms Itching: *** Pain over rash: *** Feeling ill all over: *** Fever: *** Mouth sores: *** Face or tongue swelling: *** Trouble breathing: *** Joint swelling or pain: ***      PERTINENT  PMH / PSH:     OBJECTIVE:  LMP 08/16/2021 (Approximate)   General: NAD, pleasant, able to participate in exam Cardiac: RRR, no murmurs auscultated Respiratory: CTAB, normal WOB Abdomen: soft, non-tender, non-distended, normoactive bowel sounds Extremities: warm and well perfused, no edema or cyanosis Skin: warm and dry, no rashes noted Neuro: alert, no obvious focal deficits, speech normal Psych: Normal affect and mood  ASSESSMENT/PLAN:  No problem-specific Assessment & Plan notes found for this encounter.   No orders of the defined types were placed in this encounter.  No orders of the defined types were placed in this encounter.  No follow-ups on file. @SIGNNOTE @ {    This will disappear when note is signed, click to select method of visit    :1}

## 2021-09-27 ENCOUNTER — Ambulatory Visit (INDEPENDENT_AMBULATORY_CARE_PROVIDER_SITE_OTHER): Payer: Self-pay | Admitting: Family Medicine

## 2021-09-27 VITALS — BP 107/67 | HR 73 | Wt 127.8 lb

## 2021-09-27 DIAGNOSIS — R21 Rash and other nonspecific skin eruption: Secondary | ICD-10-CM

## 2021-09-27 DIAGNOSIS — L309 Dermatitis, unspecified: Secondary | ICD-10-CM

## 2021-09-27 MED ORDER — HYDROCORTISONE 0.5 % EX OINT: 1.0000 | TOPICAL_OINTMENT | Freq: Two times a day (BID) | CUTANEOUS | 0 refills | Status: AC

## 2021-09-27 NOTE — Progress Notes (Signed)
    SUBJECTIVE:   CHIEF COMPLAINT / HPI:   Eyelid/Facial Rash:  Started a month ago  Flaky and red under eyes Allergies throughout the month as well this year  Daughter has eczema that bothers her  Hydrocortisone helped with the symptoms Washes face with Cetaphil, uses Eucerin  Uses dove for soap, no change in detergents   Changed make up brushes because she was concerned about infection; did not provide any benefit Very itchy to the patient   PERTINENT  PMH / PSH: Depression-on no medications   OBJECTIVE:   BP 107/67   Pulse 73   Wt 127 lb 12.8 oz (58 kg)   LMP 08/16/2021 (Approximate)   SpO2 100%   BMI 22.64 kg/m   Skin: small erythematous and flaking lesion under left eye, no other rashes noted   ASSESSMENT/PLAN:   Eczema Likely eczema given presentation; will treat with low dose hydrocortisone to use BID for a couple of weeks. Then use unscented lotion and vasoline/aquaphor as tolerated. No evidence of blepharitis, no systemic symptoms to cause concern for dermatomyositis. Return if there is no improvement.      Audrey Martinez, MD [facility]

## 2021-09-27 NOTE — Assessment & Plan Note (Signed)
Likely eczema given presentation; will treat with low dose hydrocortisone to use BID for a couple of weeks. Then use unscented lotion and vasoline/aquaphor as tolerated. No evidence of blepharitis, no systemic symptoms to cause concern for dermatomyositis. Return if there is no improvement.

## 2021-09-27 NOTE — Patient Instructions (Addendum)
It was great to see you today! Thank you for choosing [facility] for your primary care. Audrey Jensen was seen for eyelid rash.  Today we addressed: That this is likely eczema, we will continue with a low dose hydrocortisone cream to use on your face twice a day for a couple of weeks.  Continue with Eczema can get better or worse depending on the time of year and sometimes without any trigger. The best treatment is prevention.  Prevent eczema flares by:  - Moisturize your skin 1-2 times a day EVERY day with a mild, unscented lotion such as Aveeno, CeraVe, Cetaphil or Eucerin. At night, let the lotion dry and then cover with a barrier ointment such as Vaseline or Aquaphor - In the bath, use a mild, unscented soap such as Dove - When washing clothes, use a fragrance-free laundry detergent  No orders of the defined types were placed in this encounter.  Meds ordered this encounter  Medications   hydrocortisone ointment 0.5 %    Sig: Apply 1 Application topically 2 (two) times daily. Use for a couple of weeks    Dispense:  30 g    Refill:  0    If you haven't already, sign up for My Chart to have easy access to your labs results, and communication with your primary care physician.  You should return to our clinic Return if symptoms worsen or fail to improve.  I recommend that you always bring your medications to each appointment as this makes it easy to ensure you are on the correct medications and helps [place] not miss refills when you need them.  Please arrive 15 minutes before your appointment to ensure smooth check in process.  We appreciate your efforts in making this happen.  Please call the clinic at [phone] if your symptoms worsen or you have any concerns.  Thank you for allowing me to participate in your care, Ylianna Almanzar [employer]

## 2022-01-16 ENCOUNTER — Other Ambulatory Visit: Payer: Self-pay

## 2022-01-16 DIAGNOSIS — Z1231 Encounter for screening mammogram for malignant neoplasm of breast: Secondary | ICD-10-CM

## 2022-01-29 ENCOUNTER — Ambulatory Visit (INDEPENDENT_AMBULATORY_CARE_PROVIDER_SITE_OTHER): Payer: Self-pay | Admitting: Family Medicine

## 2022-01-29 ENCOUNTER — Encounter: Payer: Self-pay | Admitting: Family Medicine

## 2022-01-29 VITALS — BP 115/75 | HR 74 | Temp 97.9°F | Ht 63.0 in | Wt 130.6 lb

## 2022-01-29 DIAGNOSIS — R0981 Nasal congestion: Secondary | ICD-10-CM

## 2022-01-29 DIAGNOSIS — R42 Dizziness and giddiness: Secondary | ICD-10-CM

## 2022-01-29 DIAGNOSIS — Z0000 Encounter for general adult medical examination without abnormal findings: Secondary | ICD-10-CM

## 2022-01-29 DIAGNOSIS — R200 Anesthesia of skin: Secondary | ICD-10-CM

## 2022-01-29 DIAGNOSIS — Z23 Encounter for immunization: Secondary | ICD-10-CM

## 2022-01-29 LAB — POCT GLYCOSYLATED HEMOGLOBIN (HGB A1C): Hemoglobin A1C: 5.6 % (ref 4.0–5.6)

## 2022-01-29 MED ORDER — FLUTICASONE PROPIONATE 50 MCG/ACT NA SUSP: 1.0000 | Freq: Every day | NASAL | 0 refills | Status: DC

## 2022-01-29 NOTE — Patient Instructions (Addendum)
It was great seeing you today!  You came in for your physical we also checked your blood sugar which was at the upper end of normal at 5.6  For your head congestion, I recommend using Flonase 1 spray in each nostril daily for the next couple of weeks.  He can also take a Zyrtec daily to help as well.  For your carpal tunnel I recommend using a wrist brace at night and then doing stretches of your hand during the day. You can get this from the pharmacy, or online through [facility]. If you continue to have issues despite using the splint for the next couple of weeks we can check on further evaluation.  I recommend getting 150 minutes of exercise a week which will be good for your heart and mood.   For information on therapists, please go to [email or web address]. You can also contact your insurance company to find an Theatre manager.   Please check-out at the front desk before leaving the clinic. Schedule to return in 4 weeks after your mammogram for follow up on your breast and congestion, but if you need to be seen earlier than that for any new issues we're happy to fit you in, just give [place] a call!  Visit Reminders: - Stop by the pharmacy to pick up your prescriptions  - Continue to work on your healthy eating habits and incorporating exercise into your daily life.    Feel free to call with any questions or concerns at any time, at [phone].   Take care,  Dr. Shary Key [facility]

## 2022-01-29 NOTE — Progress Notes (Addendum)
    SUBJECTIVE:   Chief compliant/HPI: annual examination  Audrey Jensen is a 46 y.o. who presents today for an annual exam and several concerns.   In morning has a lot of mucous and head congestion. Sometimes feels the congestion in the back of her throat. Has been using affrin. Also feels tension in the back of her head/ neck. A couple of days ago felt a little dizzy when she closed her eyes but not currently. Denies syncopal episodes.   Feels numbess of her right hand. Uses her hand a lot for cleaning. Feels relief when she shakes out her hand. Denies injury.   Also mentions breast tenderness and concerns with mammogram scheduled in a couple weeks  Tobacco use: no Alcohol: no Recreational drug use: no  Does not exercise   Sexually active: yes with husband. No new partners    OBJECTIVE:   BP 115/75   Pulse 74   Temp 97.9 F (36.6 C)   Ht 5\' 3"  (1.6 m)   Wt 130 lb 9.6 oz (59.2 kg)   SpO2 100%   BMI 23.13 kg/m    Physical exam General: well appearing, NAD Cardiovascular: RRR, no murmurs Lungs: CTAB. Normal WOB Abdomen: soft, non-distended Skin: warm, dry. No edema Hands: Inspection: No obvious deformity b/l. No swelling, erythema or bruising b/l No swelling in PIP, DIP joints b/l.  ROM: Full ROM of the digits and wrist b/l. Fully able to extend and flex finger. Neurovascular: NV intact b/l   ASSESSMENT/PLAN:   Numbness of right hand Likely due to carpal tunnel given the numbness distribution of her hand and chronic use of her hands at work. Shaking out her hand provides some relief. Discussed getting a wrist brace to use at night, and doing stretches during the day. If no improvement after a couple of weeks can refer to sports medicine  Head congestion Recommended discontinuing Affrin and starting Flonase daily as well as Zyrtec    Annual Examination  See AVS for recommendations.  PHQ score 0 Blood pressure value is at goal.   Considered the  following screening exams based upon USPSTF recommendations: Diabetes screening: ordered. A1c 5.6 Screening for elevated cholesterol: not ordered HIV testing: Negative 4 mo ago. No new partners  Hepatitis C: Negative 4 mo ago Syphilis if at high risk: Negative 4 mo ago Reviewed risk factors for latent tuberculosis and not indicated Reviewed risk factors for osteoporosis. Low risk   Cervical cancer screening: prior Pap reviewed, repeat due in 2025 Breast cancer screening: scheduled for 11/27 Colorectal cancer screening: Negative FOBT less than 1 year ago.   Discussed incorporating exercise and stretching which can help with her neck and shoulder tightness as well as with mood and the cardiovascular benefits    Follow up in 4 weeks after mammogram

## 2022-01-30 ENCOUNTER — Other Ambulatory Visit: Payer: Self-pay

## 2022-01-30 DIAGNOSIS — R0981 Nasal congestion: Secondary | ICD-10-CM | POA: Insufficient documentation

## 2022-01-30 DIAGNOSIS — R200 Anesthesia of skin: Secondary | ICD-10-CM | POA: Insufficient documentation

## 2022-01-30 DIAGNOSIS — N6452 Nipple discharge: Secondary | ICD-10-CM

## 2022-01-30 NOTE — Assessment & Plan Note (Signed)
Recommended discontinuing Affrin and starting Flonase daily as well as Zyrtec

## 2022-01-30 NOTE — Assessment & Plan Note (Signed)
Likely due to carpal tunnel given the numbness distribution of her hand and chronic use of her hands at work. Shaking out her hand provides some relief. Discussed getting a wrist brace to use at night, and doing stretches during the day. If no improvement after a couple of weeks can refer to sports medicine

## 2022-02-19 ENCOUNTER — Ambulatory Visit
Admission: RE | Admit: 2022-02-19 | Discharge: 2022-02-19 | Disposition: A | Discharge status: Home / Self Care | Payer: No Typology Code available for payment source | Source: Ambulatory Visit | Attending: Obstetrics and Gynecology | Admitting: Obstetrics and Gynecology

## 2022-02-19 ENCOUNTER — Ambulatory Visit: Payer: Self-pay | Admitting: *Deleted

## 2022-02-19 ENCOUNTER — Other Ambulatory Visit (HOSPITAL_COMMUNITY)
Admission: RE | Admit: 2022-02-19 | Discharge: 2022-02-19 | Disposition: A | Discharge status: Home / Self Care | Payer: Self-pay | Source: Ambulatory Visit | Attending: Obstetrics and Gynecology | Admitting: Obstetrics and Gynecology

## 2022-02-19 VITALS — BP 142/82 | Wt 127.0 lb

## 2022-02-19 DIAGNOSIS — N6452 Nipple discharge: Secondary | ICD-10-CM

## 2022-02-19 DIAGNOSIS — Z1211 Encounter for screening for malignant neoplasm of colon: Secondary | ICD-10-CM

## 2022-02-19 DIAGNOSIS — Z1239 Encounter for other screening for malignant neoplasm of breast: Secondary | ICD-10-CM

## 2022-02-19 NOTE — Patient Instructions (Signed)
Explained breast self awareness with Audrey Jensen. Patient did not need a Pap smear today due to last Pap smear and HPV typing was 01/07/2019. Let her know BCCCP will cover Pap smears and HPV typing every 5 years unless has a history of abnormal Pap smears. Referred patient to the [facility] for a diagnostic mammogram. Appointment scheduled Tuesday, February 19, 2022 at 1520. Patient aware of appointment and will be there. Let patient know will follow up with her within the next couple weeks with results of breast discharge by phone. Alroy Bailiff Derrek Gu verbalized understanding.  Lionell Matuszak, Kathaleen Maser, RN 1:15 PM

## 2022-02-19 NOTE — Progress Notes (Signed)
[name] is a 46 y.o. female who presents to [facility] clinic today with complaint of right breast discharge when expressed x one month. Complaints of bilateral breast tenderness today that per patient is due for her menstrual cycle next week.   Pap Smear: Pap smear not completed today. Last Pap smear was 01/07/2019 at [facility] clinic and was normal with negative HPV. Per patient has no history of an abnormal Pap smear. Last Pap smear result is available in Epic.   Physical exam: Breasts Breasts symmetrical. No skin abnormalities bilateral breasts. No nipple retraction bilateral breasts. No nipple discharge left breast. Expressed a scant amount of milky discharge right nipple. Sample of discharge sent to Cytology for evaluation. No lymphadenopathy. No lumps palpated bilateral breasts. No complaints of pain or tenderness on exam.     MM 3D SCREEN BREAST BILATERAL  Result Date: 01/09/2021 CLINICAL DATA:  Screening. EXAM: DIGITAL SCREENING BILATERAL MAMMOGRAM WITH TOMOSYNTHESIS AND CAD TECHNIQUE: Bilateral screening digital craniocaudal and mediolateral oblique mammograms were obtained. Bilateral screening digital breast tomosynthesis was performed. The images were evaluated with computer-aided detection. COMPARISON:  Previous exam(s). ACR Breast Density Category c: The breast tissue is heterogeneously dense, which may obscure small masses. FINDINGS: There are no findings suspicious for malignancy. IMPRESSION: No mammographic evidence of malignancy. A result letter of this screening mammogram will be mailed directly to the patient. RECOMMENDATION: Screening mammogram in one year. (Code:SM-B-01Y) BI-RADS CATEGORY  1: Negative. Electronically Signed   By: Amie Portland M.D.   On: 01/09/2021 13:42   MS DIGITAL SCREENING TOMO BILATERAL  Result Date: 10/28/2019 CLINICAL DATA:  Screening. EXAM: DIGITAL SCREENING BILATERAL MAMMOGRAM WITH TOMO AND CAD COMPARISON:  Previous  exam(s). ACR Breast Density Category b: There are scattered areas of fibroglandular density. FINDINGS: There are no findings suspicious for malignancy. Images were processed with CAD. IMPRESSION: No mammographic evidence of malignancy. A result letter of this screening mammogram will be mailed directly to the patient. RECOMMENDATION: Screening mammogram in one year. (Code:SM-B-01Y) BI-RADS CATEGORY  1: Negative. Electronically Signed   By: Hulan Saas M.D.   On: 10/28/2019 08:40   MS DIGITAL DIAG TOMO UNI LEFT  Result Date: 06/18/2018 CLINICAL DATA:  46 year old female recalled from baseline screening mammogram dated 06/04/2018 for a possible left breast mass. EXAM: DIGITAL DIAGNOSTIC UNILATERAL LEFT MAMMOGRAM WITH CAD AND TOMO COMPARISON:  Previous exam(s). ACR Breast Density Category c: The breast tissue is heterogeneously dense, which may obscure small masses. FINDINGS: Previously described, possible mass in the central medial left breast at mid to posterior depth resolves into well dispersed fibroglandular tissue on today's additional views. No suspicious findings are identified. Mammographic images were processed with CAD. IMPRESSION: No mammographic evidence of malignancy. RECOMMENDATION: Screening mammogram in one year.(Code:SM-B-01Y) I have discussed the findings and recommendations with the patient. Results were also provided in writing at the conclusion of the visit. If applicable, a reminder letter will be sent to the patient regarding the next appointment. BI-RADS CATEGORY  1: Negative. Electronically Signed   By: Sande Brothers M.D.   On: 06/18/2018 09:55   MS DIGITAL SCREENING TOMO BILATERAL  Addendum Date: 06/05/2018   ADDENDUM REPORT: 06/05/2018 11:23 ADDENDUM: An addendum is made to the report due to a typo. The corrections are as below: Comparison: none Breast density: b: There are scattered areas of fibroglandular density. The remainder of the report is correct. Electronically Signed    By: Edwin Cap M.D.   On: 06/05/2018 11:23  Result Date: 06/05/2018 CLINICAL DATA:  Screening. EXAM: DIGITAL SCREENING BILATERAL MAMMOGRAM WITH TOMO AND CAD COMPARISON:  Previous exam(s). ACR Breast Density Category none FINDINGS: In the left breast, a possible mass warrants further evaluation. In the right breast, no findings suspicious for malignancy. Images were processed with CAD. IMPRESSION: Further evaluation is suggested for possible mass in the left breast. RECOMMENDATION: Diagnostic mammogram and possibly ultrasound of the left breast. (Code:FI-L-10M) The patient will be contacted regarding the findings, and additional imaging will be scheduled. BI-RADS CATEGORY  0: Incomplete. Need additional imaging evaluation and/or prior mammograms for comparison. Electronically Signed: By: Edwin Cap M.D. On: 06/05/2018 10:51    Pelvic/Bimanual Pap is not indicated today per BCCCP guidelines.   Smoking History: Patient has never smoked.   Patient Navigation: Patient education provided. Access to services provided for patient through [facility] program. Spanish  interpreter Audrey Jensen from [facility] provided.   Colorectal Cancer Screening: Per patient has never had colonoscopy completed. FIT Test given to patient to complete. No complaints today.    Breast and Cervical Cancer Risk Assessment: Patient does not have family history of breast cancer, known genetic mutations, or radiation treatment to the chest before age 35. Patient does not have history of cervical dysplasia, immunocompromised, or DES exposure in-utero.   Risk Scores as of 02/19/2022     Audrey Jensen           5-year 0.69 %   Lifetime 7.79 %   Because the patient's race/ethnicity is unknown, the model is using data for white patients and might not be accurate.         Last calculated by Caprice Red, CMA on 02/19/2022 at  1:22 PM       A: BCCCP exam without pap smear Complaints of right breast milky discharge when  expressed.  P: Referred patient to the [facility] for a diagnostic mammogram. Appointment scheduled Tuesday, February 19, 2022 at 1520.  Priscille Heidelberg, RN 02/19/2022 1:15 PM

## 2022-02-22 LAB — CYTOLOGY - NON PAP

## 2022-02-26 ENCOUNTER — Ambulatory Visit (INDEPENDENT_AMBULATORY_CARE_PROVIDER_SITE_OTHER): Payer: Self-pay | Admitting: Family Medicine

## 2022-02-26 ENCOUNTER — Telehealth: Payer: Self-pay

## 2022-02-26 ENCOUNTER — Encounter: Payer: Self-pay | Admitting: Family Medicine

## 2022-02-26 VITALS — BP 100/60 | HR 76 | Ht 63.0 in | Wt 130.0 lb

## 2022-02-26 DIAGNOSIS — R0981 Nasal congestion: Secondary | ICD-10-CM

## 2022-02-26 DIAGNOSIS — D509 Iron deficiency anemia, unspecified: Secondary | ICD-10-CM

## 2022-02-26 DIAGNOSIS — R5383 Other fatigue: Secondary | ICD-10-CM

## 2022-02-26 NOTE — Progress Notes (Signed)
    SUBJECTIVE:   CHIEF COMPLAINT / HPI:   Patient presents to follow up on congestion- last visit advised her to stop Afrin start Flonase and Zyrtec. Still having a lot of mucous despite using it her flonase daily in the morning.  Has been using cleaning products for a long time with her job thinks that is affecting it, wants to change her products   Discussed her carpal tunnel and starting wrist brace. Statess he has been using it and it has helped her   Interested in checking her hemoglobin states she has been anemic in the past and wants to know if she needs to take iron .  PERTINENT  PMH / PSH: Reviewed   OBJECTIVE:   Ht 5\' 3"  (1.6 m)   Wt 130 lb (59 kg)   LMP 01/24/2022 (Approximate)   BMI 23.03 kg/m    Physical exam General: well appearing, NAD Cardiovascular: RRR, no murmurs Lungs: CTAB. Normal WOB Abdomen: soft, non-distended, non-tender Skin: warm, dry. No edema  ASSESSMENT/PLAN:   No problem-specific Assessment & Plan notes found for this encounter.     01/26/2022, DO [facility]

## 2022-02-26 NOTE — Patient Instructions (Addendum)
It was great seeing you today!  For your congestion I recommend using the Flonase every night, and using a humidifier to see if that helps.  I also recommend using Zyrtec every day which she can also go over-the-counter and that should not make you drowsy.  You can get a woman's One-A-Day multivitamin over-the-counter.  You also get a check your blood work to check for anemia and your electrolytes, I will call you if anything is abnormal we will send a MyChart message if normal.  I have also provided with resources below for therapy if you become interested.  Feel free to call with any questions or concerns at any time, at [phone].   Take care,  Dr. Cora Collum [facility]   Outpatient Mental Health Providers (No Insurance required or Self Pay)  [facility]  [phone] [email or web address] Marcy Panning and [facility]  [address] [place], [name] [phone] Crisis [phone]  [facility]) can see uninsured folks for outpatient therapy [email or web address] [address] [place], [name] [address] [phone]  [facility]    Walk-in Mon-Fri, 8am-3pm [email or web address] Scull [address], [place], [name]  [phone]   [address]  [place] [phone] [facility] for psych med management, there may be a wait- if [facility] is working with clients for OPT, they will coordinate with [facility] for psych  [facility]   Walk-in-Clinic: Monday- Friday 9:00 AM - 4:00 PM [address]   [place], [name] [phone]  Family Services of the [place] ([employer]) walk in M-F 8am-12pm and  1pm-3pm [place]- [address]     [phone]  [employer] -[address]  Phone: [phone]  [employer] (Mental Health and substance challenges) [address]   [name] [phone]    [email or web address]    [address]  [employer] of the [facility]  [facility] -[address], [place]     Phone:  [phone] [facility]-  [address]  [phone]    [facility] ( virtual or zoom therapy) [email or web address]  [address] [name]  [phone]    [facility] [phone]  grief counseling, dementia and caregiver support    [facility] Walk-in MWF 12:30 to 3:00     [address] [name] [address]  [phone]  [email or web address] call to schedule an appointment     National Alliance on Mental Illness (NAMI) [place]- Wellness classes, Support groups        [address] [place], [name] [address] [phone]   [email or web address]   [facility]  (Psycho-social Rehabilitation clubhouse, Individual and group therapy) [address] [place], [name] [address]   [phone]- [phone]  24- Hour Availability:  [facility] [phone] or [phone] * Family Service of the [employer] (Domestic Violence, Rape, etc. )7850440214 Vesta Mixer [phone] or [phone] * [facility] [phone] only) [phone] (after hours) *Therapeutic Alternative Mobile Crisis Unit [phone] *[place] National Suicide Hotline [phone] Len Childs)

## 2022-02-26 NOTE — Telephone Encounter (Signed)
I have spoken with the pt and advised of her the results from her nipple discharge were benign. Pt expressed understanding of the information and was happy for the news.

## 2022-02-27 DIAGNOSIS — D509 Iron deficiency anemia, unspecified: Secondary | ICD-10-CM | POA: Insufficient documentation

## 2022-02-27 LAB — BASIC METABOLIC PANEL
BUN/Creatinine Ratio: 19 (ref 9–23)
BUN: 11 mg/dL (ref 6–24)
CO2: 19 mmol/L — ABNORMAL LOW (ref 20–29)
Calcium: 9 mg/dL (ref 8.7–10.2)
Chloride: 101 mmol/L (ref 96–106)
Creatinine, Ser: 0.58 mg/dL (ref 0.57–1.00)
Glucose: 92 mg/dL (ref 70–99)
Potassium: 3.8 mmol/L (ref 3.5–5.2)
Sodium: 137 mmol/L (ref 134–144)
eGFR: 113 mL/min/{1.73_m2} (ref >59)

## 2022-02-27 LAB — CBC
Hematocrit: 36 % (ref 34.0–46.6)
Hemoglobin: 11.8 g/dL (ref 11.1–15.9)
MCH: 27.4 pg (ref 26.6–33.0)
MCHC: 32.8 g/dL (ref 31.5–35.7)
MCV: 84 fL (ref 79–97)
Platelets: 247 10*3/uL (ref 150–450)
RBC: 4.3 x10E6/uL (ref 3.77–5.28)
RDW: 13.2 % (ref 11.7–15.4)
WBC: 6.8 10*3/uL (ref 3.4–10.8)

## 2022-02-27 NOTE — Assessment & Plan Note (Signed)
Last hgb 10.9 two years ago. Still having heavy menses. Not taking iron. Feeling some fatigue and would like to recheck levels.  - CBC, BMP

## 2022-02-27 NOTE — Assessment & Plan Note (Signed)
Not improved with Flonase. Discussed adding Zyrtec daily and trying Flonase at night since she wakes up with a lot of congestion. Also discussed avoiding triggers and trying a humidifier.

## 2022-04-03 ENCOUNTER — Other Ambulatory Visit (HOSPITAL_COMMUNITY)
Admission: RE | Admit: 2022-04-03 | Discharge: 2022-04-03 | Disposition: A | Discharge status: Home / Self Care | Payer: Self-pay | Source: Ambulatory Visit | Attending: Family Medicine | Admitting: Family Medicine

## 2022-04-03 ENCOUNTER — Encounter: Payer: Self-pay | Admitting: Family Medicine

## 2022-04-03 ENCOUNTER — Ambulatory Visit (INDEPENDENT_AMBULATORY_CARE_PROVIDER_SITE_OTHER): Payer: Self-pay | Admitting: Family Medicine

## 2022-04-03 VITALS — BP 110/60 | HR 87 | Ht 63.0 in | Wt 133.0 lb

## 2022-04-03 DIAGNOSIS — N898 Other specified noninflammatory disorders of vagina: Secondary | ICD-10-CM | POA: Insufficient documentation

## 2022-04-03 DIAGNOSIS — R300 Dysuria: Secondary | ICD-10-CM

## 2022-04-03 LAB — POCT WET PREP (WET MOUNT)
Clue Cells Wet Prep Whiff POC: NEGATIVE
Trichomonas Wet Prep HPF POC: ABSENT
WBC, Wet Prep HPF POC: NONE SEEN

## 2022-04-03 LAB — POCT URINALYSIS DIP (MANUAL ENTRY)
Bilirubin, UA: NEGATIVE
Blood, UA: NEGATIVE
Glucose, UA: NEGATIVE mg/dL
Ketones, POC UA: NEGATIVE mg/dL
Leukocytes, UA: NEGATIVE
Nitrite, UA: NEGATIVE
Protein Ur, POC: NEGATIVE mg/dL
Spec Grav, UA: 1.02 (ref 1.010–1.025)
Urobilinogen, UA: 0.2 E.U./dL
pH, UA: 6 (ref 5.0–8.0)

## 2022-04-03 LAB — POCT URINE PREGNANCY: Preg Test, Ur: NEGATIVE

## 2022-04-03 NOTE — Patient Instructions (Addendum)
It was wonderful to see you today.  Please bring ALL of your medications with you to every visit.   Today we talked about:  -Checking for infection--I will call you or message with final results   - Try this vaginal moisturizer       Please follow up in 2-3 months to check in on vaginal symptoms   Thank you for choosing [facility].   Please call [phone] with any questions about today's appointment.  Please be sure to schedule follow up at the front  desk before you leave today.   Dorris Singh, MD  Family Medicine

## 2022-04-03 NOTE — Progress Notes (Signed)
    SUBJECTIVE:   CHIEF COMPLAINT: vaginal discharge HPI:   Audrey Jensen is a 47 y.o.  with history notable for iron deficiency anemia  presenting for vaginal discharge.   The patient reports 2 days of vaginal dryness and irritation. Has tried OTC monostat without success. Reports her periods have become irregular over past several months. She is noticing hot flashes. She is noticing more vaginal dryness. Uses Latex free condoms.  She uses condoms for contraception.  Last menstrual period early December.  Current partners husband.  Last Pap 2020 normal .  Prior infections include none .  She is not a PREP candidate.   PERTINENT  PMH / PSH/Family/Social History : updated and reviewed  OBJECTIVE:   BP 110/60   Pulse 87   Ht 5\' 3"  (1.6 m)   Wt 133 lb (60.3 kg)   LMP 03/02/2022 (Approximate)   SpO2 98%   BMI 23.56 kg/m   Today's weight:  Last Weight  Most recent update: 04/03/2022  2:03 PM    Weight  60.3 kg (133 lb)            Review of prior weights: [employer]   04/03/22 1402  Weight: 133 lb (60.3 kg)   GU Exam:    External exam: Normal-appearing female external genitalia.  Vaginal exam notable for normal discharge.  Cervix without discharge or obvious lesion.  Chaperoned examine, CMA Saunders.    ASSESSMENT/PLAN:   Vaginal irritation Suspect due to dryness related to possible perimenopause vs. Contact from condoms Recommended lubricant with condoms Recommended replens cleanser--if not improved, consider vaginal estrogen at next visit Wet prep negative, awaiting GC/CT Discussed contraception---will continue with condoms Discussed menopausal symptoms--this she may be entering into menopause   Return in 2-3 months to check symptoms Consider discussion of colonoscopy at follow up        Dorris Singh, [place]

## 2022-04-04 LAB — CERVICOVAGINAL ANCILLARY ONLY
Chlamydia: NEGATIVE
Comment: NEGATIVE
Comment: NEGATIVE
Comment: NORMAL
Neisseria Gonorrhea: NEGATIVE
Trichomonas: NEGATIVE

## 2022-07-16 LAB — FECAL OCCULT BLOOD, IMMUNOCHEMICAL

## 2022-12-04 ENCOUNTER — Ambulatory Visit (INDEPENDENT_AMBULATORY_CARE_PROVIDER_SITE_OTHER): Payer: Self-pay | Admitting: Family Medicine

## 2022-12-04 ENCOUNTER — Encounter: Payer: Self-pay | Admitting: Family Medicine

## 2022-12-04 VITALS — BP 98/58 | HR 82 | Wt 135.0 lb

## 2022-12-04 DIAGNOSIS — N912 Amenorrhea, unspecified: Secondary | ICD-10-CM

## 2022-12-04 DIAGNOSIS — R102 Pelvic and perineal pain: Secondary | ICD-10-CM

## 2022-12-04 LAB — POCT URINALYSIS DIP (MANUAL ENTRY)
Bilirubin, UA: NEGATIVE
Blood, UA: NEGATIVE
Glucose, UA: NEGATIVE mg/dL
Ketones, POC UA: NEGATIVE mg/dL
Leukocytes, UA: NEGATIVE
Nitrite, UA: NEGATIVE
Protein Ur, POC: NEGATIVE mg/dL
Spec Grav, UA: 1.015 (ref 1.010–1.025)
Urobilinogen, UA: 0.2 U/dL
pH, UA: 7 (ref 5.0–8.0)

## 2022-12-04 LAB — POCT URINE PREGNANCY: Preg Test, Ur: NEGATIVE

## 2022-12-04 NOTE — Patient Instructions (Signed)
Good to see you today - Thank you for coming in  Things we discussed today:  Pelvic fullness - ordered a Pelvic Ultrasound - let us know if getting worse or have severe bleeding or pain - Take tylenol or ibuprofen   Make an appointment to follow up with your PCP after the ultrasound

## 2022-12-04 NOTE — Assessment & Plan Note (Signed)
Her symptoms suggest fibroids or perhaps ovarian cancer.  Will obtain pelvic ultrasound.  She related that in past [place] have not seen fibroids several years ago but then had one a the HD that showed fibroids Her back pain seems related to this or either mild muscle overuse.

## 2022-12-04 NOTE — Progress Notes (Signed)
    SUBJECTIVE:   CHIEF COMPLAINT / HPI:   Abdomen Fullness and Back Pain For several weeks.  She feels her lower abdomen is fuller than usual and feels heavy.  Thinks she has a history of fibroid.  No real pain or any dysuria or nausea and vomiting or bleeding or change in bowel movements Back pain seems related to the fullness or from her work as a Energy manager  Had vaginal exam and swabs at health department in August   PERTINENT  PMH / PSH: Depression, Anemia   OBJECTIVE:   BP (!) 98/58   Pulse 82   Wt 135 lb (61.2 kg)   LMP 09/30/2022   SpO2 98%   BMI 23.91 kg/m   Abdomen - does feel full in upper pelvis with possible mass.  Nontender. No guarding or rebound Back - FROM without pain.  Mild pain bilateral lumbar paraspinous muscles.  No vertebral tenderness Able to walk on heels and toes and do deep knee bend  ASSESSMENT/PLAN:   Amenorrhea -     POCT urine pregnancy -     POCT urinalysis dipstick  Pelvic pain Assessment & Plan: Her symptoms suggest fibroids or perhaps ovarian cancer.  Will obtain pelvic ultrasound.  She related that in past [place] have not seen fibroids several years ago but then had one a the HD that showed fibroids Her back pain seems related to this or either mild muscle overuse.   Orders: -     US PELVIC COMPLETE WITH TRANSVAGINAL; Future     Patient Instructions  Good to see you today - Thank you for coming in  Things we discussed today:  Pelvic fullness - ordered a Pelvic Ultrasound - let us know if getting worse or have severe bleeding or pain - Take tylenol or ibuprofen   Make an appointment to follow up with your PCP after the ultrasound    Carney Living, MD [facility]

## 2022-12-09 ENCOUNTER — Ambulatory Visit (HOSPITAL_COMMUNITY): Admission: RE | Admit: 2022-12-09 | Payer: Self-pay | Source: Ambulatory Visit

## 2022-12-16 ENCOUNTER — Ambulatory Visit (HOSPITAL_COMMUNITY)
Admission: RE | Admit: 2022-12-16 | Discharge: 2022-12-16 | Disposition: A | Discharge status: Home / Self Care | Payer: Self-pay | Source: Ambulatory Visit | Attending: Family Medicine | Admitting: Family Medicine

## 2022-12-16 DIAGNOSIS — R102 Pelvic and perineal pain: Secondary | ICD-10-CM | POA: Insufficient documentation

## 2022-12-19 ENCOUNTER — Telehealth: Payer: Self-pay

## 2022-12-19 NOTE — Telephone Encounter (Signed)
Patient LVM on nurse line requesting [place] results.   She rpeorts she viewed them in [place], however does not understand the interpretation.   Will forward to ordering provider.

## 2022-12-20 NOTE — Telephone Encounter (Signed)
Spoke with her  She had a normal menstrual period around 9/10 which relieved her pain.  She has not had bleeding since.  I told the [place] did not show any cancer or anything bad but confirmed the fibroid she knew about  Recommend she follow up with her PCP Keep a diary of when she has menstrual periods Can use ibuprofen if has pelvic pain  She is appreciative

## 2023-03-04 ENCOUNTER — Other Ambulatory Visit: Payer: Self-pay | Admitting: Obstetrics and Gynecology

## 2023-03-04 DIAGNOSIS — Z1231 Encounter for screening mammogram for malignant neoplasm of breast: Secondary | ICD-10-CM

## 2023-03-06 IMAGING — MG MM DIGITAL SCREENING BILAT W/ TOMO AND CAD
8 series · 9 of 24 positions shown · non-contrast
Comparison: Previous exam(s).

CLINICAL DATA: Screening.

EXAM:
DIGITAL SCREENING BILATERAL MAMMOGRAM WITH TOMOSYNTHESIS AND CAD
TECHNIQUE: Bilateral screening digital craniocaudal and mediolateral oblique
mammograms were obtained. Bilateral screening digital breast
tomosynthesis was performed. The images were evaluated with
computer-aided detection.

[R MLO synth-2D]
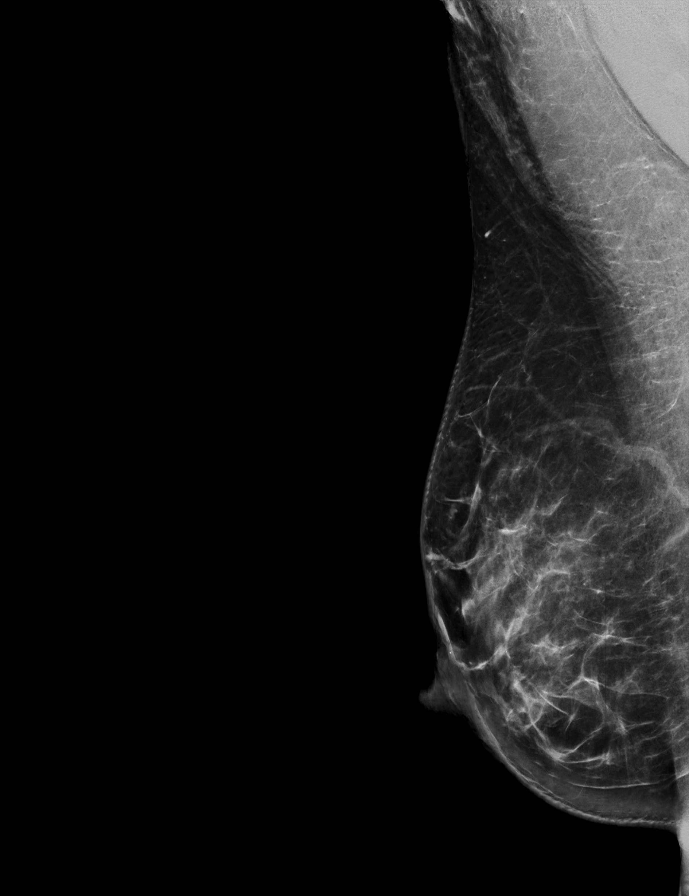

[R CC synth-2D]
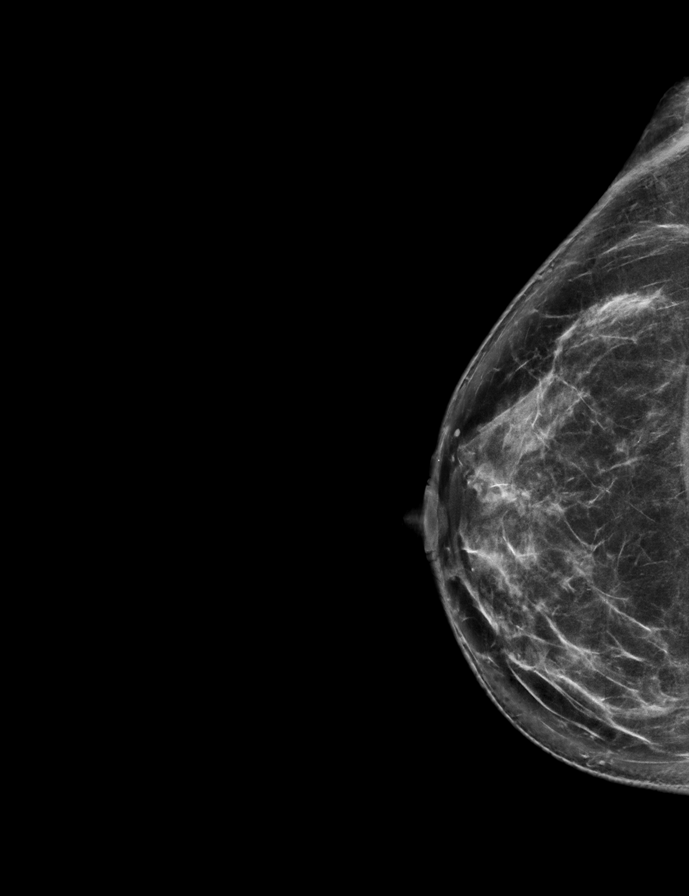

[L MLO synth-2D]
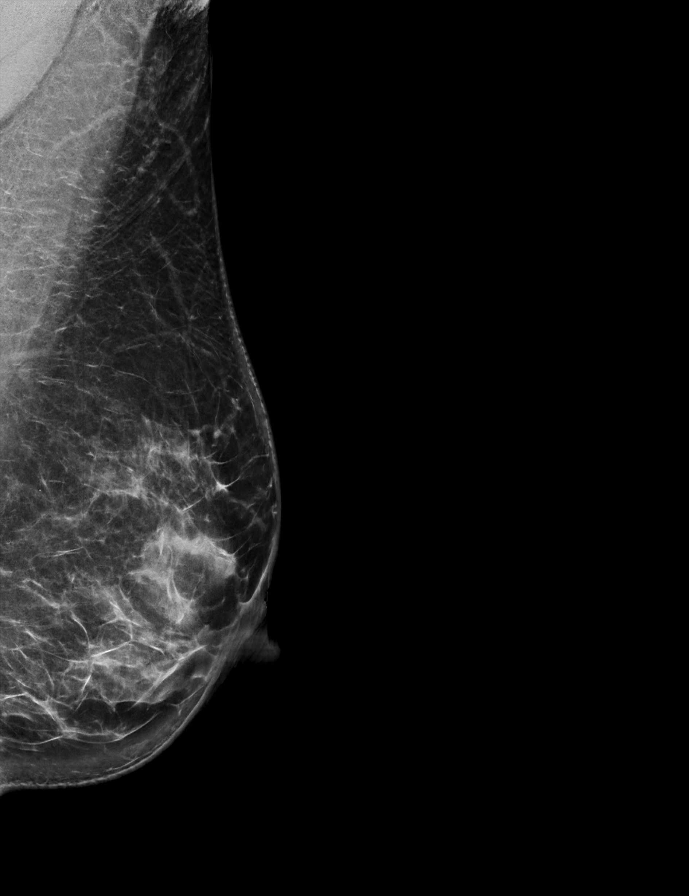

[L CC synth-2D]
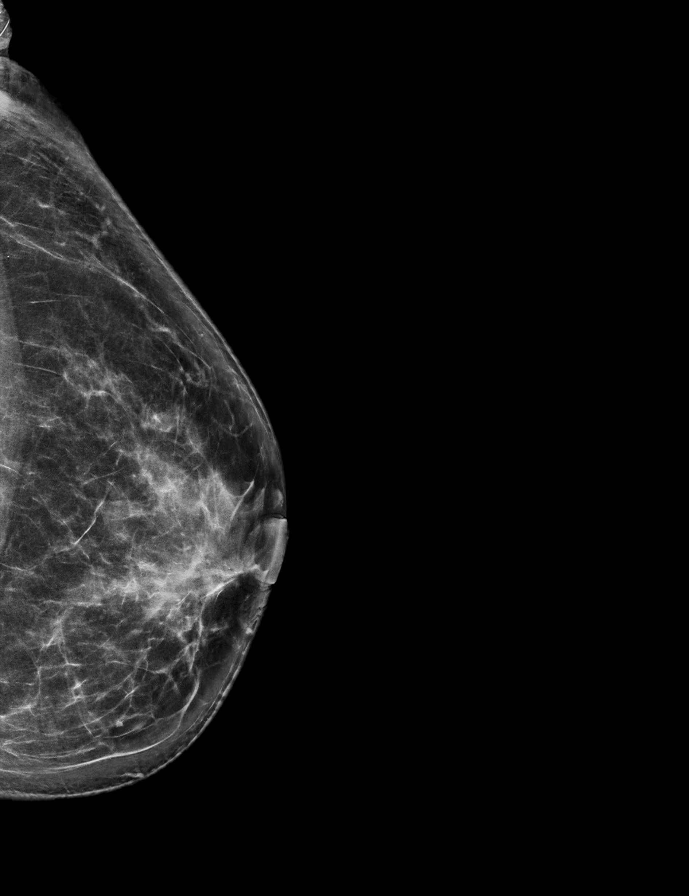

[L MLO tomo · 2 of 75 frames shown]
[frame 25/75]
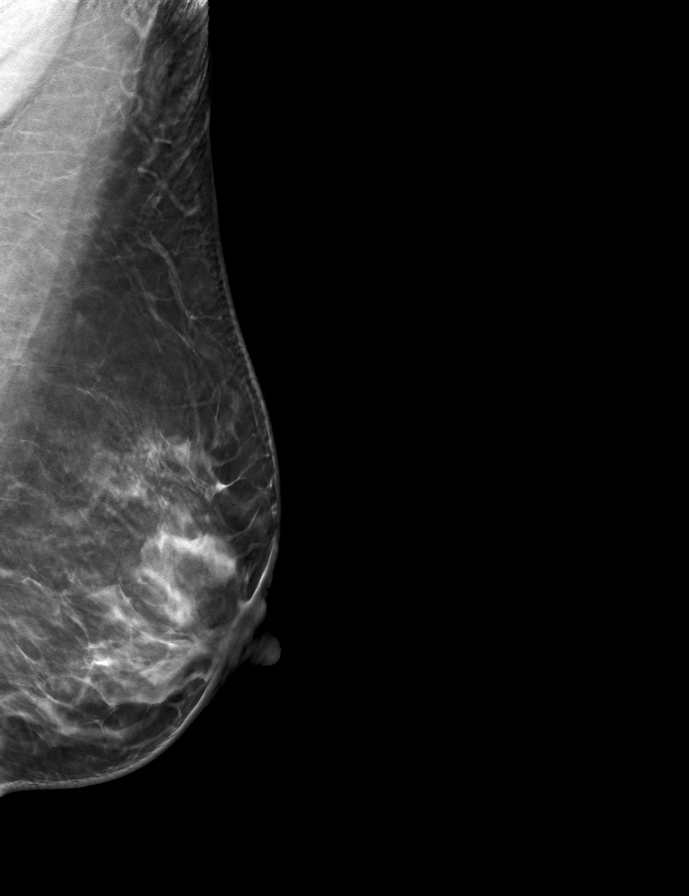
[frame 38/75]
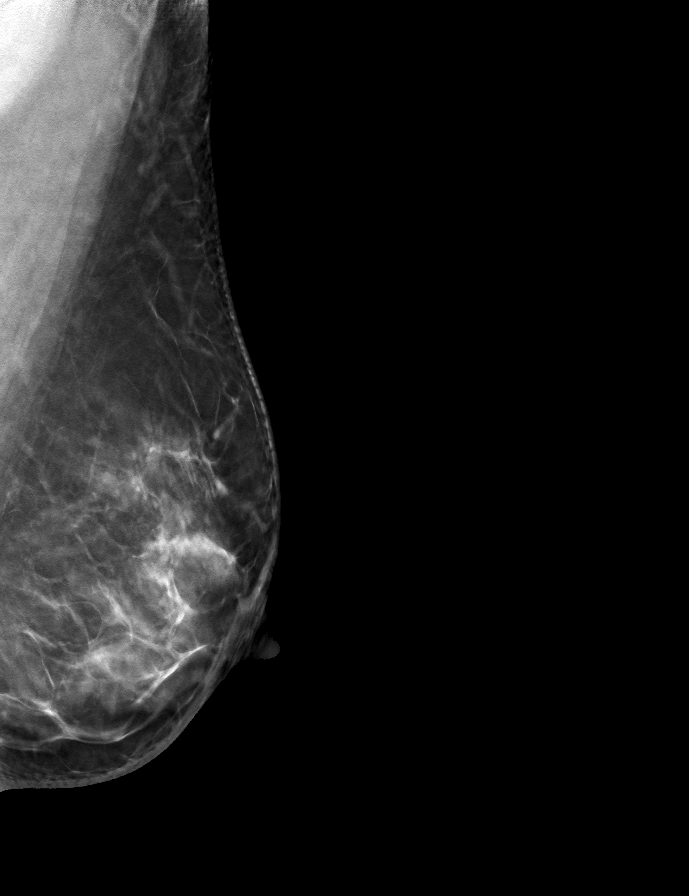

[R CC tomo · tomo slice 35/69.0]
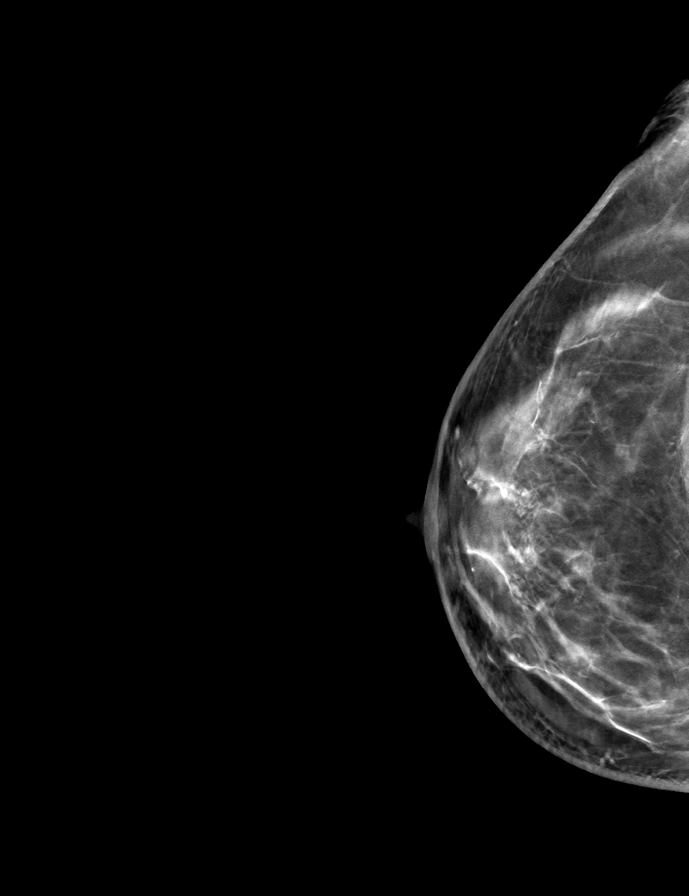

[L CC tomo · tomo slice 36/71.0]
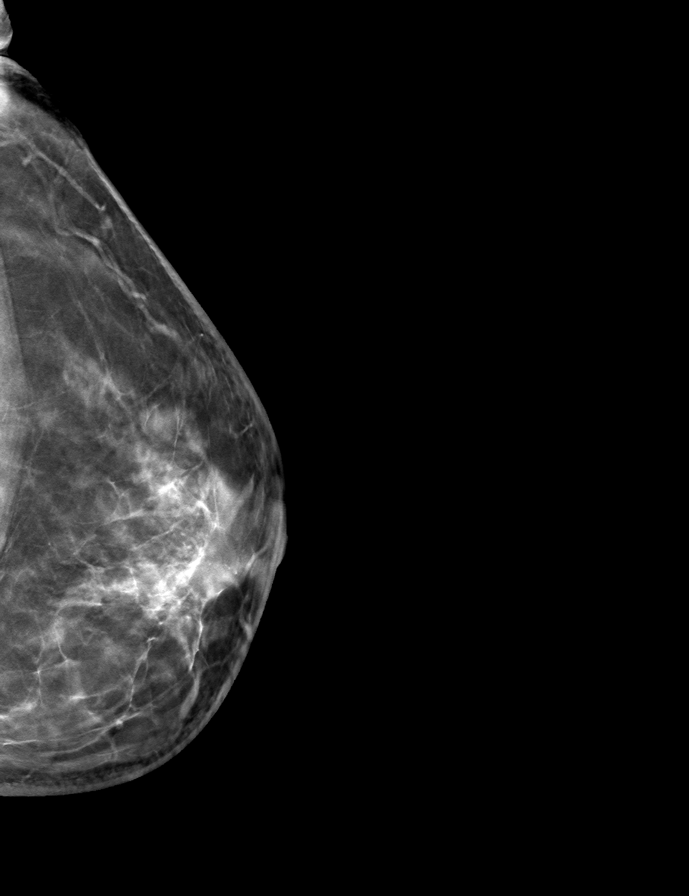

[R MLO tomo · tomo slice 39/77.0]
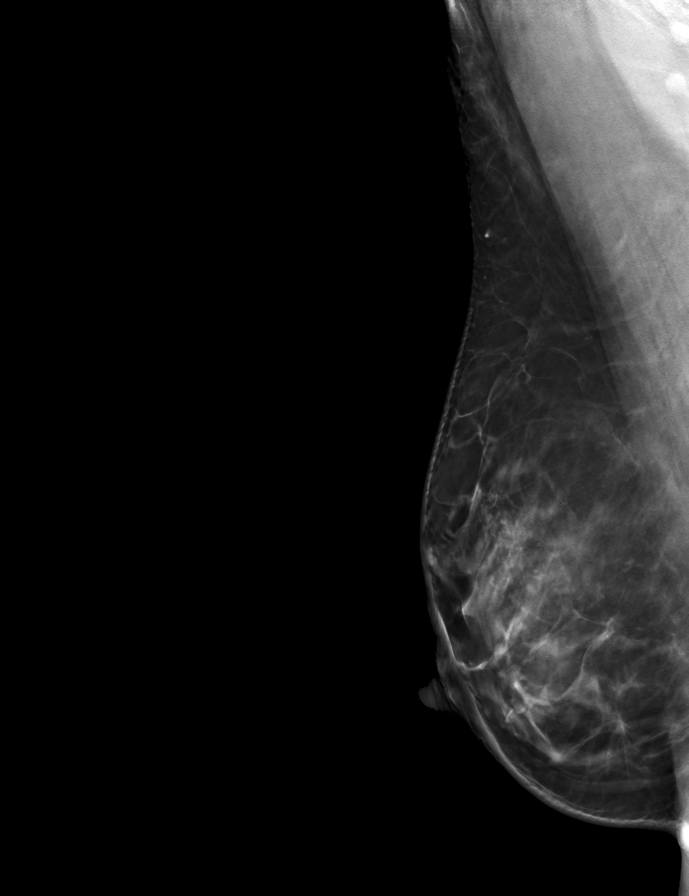

[9 of 24 positions shown; findings below may reference images not displayed]

ACR Breast Density Category c: The breast tissue is heterogeneously
dense, which may obscure small masses.
FINDINGS: There are no findings suspicious for malignancy.
IMPRESSION: No mammographic evidence of malignancy. A result letter of this
screening mammogram will be mailed directly to the patient.

RECOMMENDATION:
Screening mammogram in one year. (Code:Q3-W-BC3)

BI-RADS CATEGORY  1: Negative.

## 2023-05-20 ENCOUNTER — Ambulatory Visit: Payer: No Typology Code available for payment source | Admitting: *Deleted

## 2023-05-20 ENCOUNTER — Ambulatory Visit
Admission: RE | Admit: 2023-05-20 | Discharge: 2023-05-20 | Disposition: A | Discharge status: Home / Self Care | Payer: No Typology Code available for payment source | Source: Ambulatory Visit | Attending: Obstetrics and Gynecology | Admitting: Obstetrics and Gynecology

## 2023-05-20 VITALS — BP 119/77 | Wt 130.0 lb

## 2023-05-20 DIAGNOSIS — Z1211 Encounter for screening for malignant neoplasm of colon: Secondary | ICD-10-CM

## 2023-05-20 DIAGNOSIS — Z1231 Encounter for screening mammogram for malignant neoplasm of breast: Secondary | ICD-10-CM

## 2023-05-20 DIAGNOSIS — Z1239 Encounter for other screening for malignant neoplasm of breast: Secondary | ICD-10-CM

## 2023-05-20 NOTE — Patient Instructions (Signed)
Explained breast self awareness with [name]. Patient did not need a Pap smear today due to last Pap smear was 1 week ago per patient. Let her know BCCCP will cover Pap smears every 3 years or Pap smears and HPV typing every 5 years unless has a history of abnormal Pap smears. Referred patient to the [facility] for a screening mammogram on mobile unit. Appointment scheduled Tuesday, May 20, 2023 at 0920. Patient aware of appointment and will be there. Let patient know the Breast Center will follow up with her within the next couple weeks with results of mammogram by letter or phone. Alroy Bailiff Derrek Gu verbalized understanding.  Hance Caspers, Kathaleen Maser, RN 9:31 AM

## 2023-05-20 NOTE — Progress Notes (Signed)
Audrey Jensen is a 48 y.o. female who presents to [facility] clinic today with no complaints.    Pap Smear: Pap smear not completed today. Last Pap smear was 1 week ago at the [facility] and she is awaiting results. The last Pap smear result in EPIC is 01/07/2019 at [facility] clinic and was normal with negative HPV. Per patient has no history of an abnormal Pap smear. Last Pap smear result is not available in Epic. Will follow up with [facility] Department on last Pap smear result.  Physical exam: Breasts Breasts symmetrical. No skin abnormalities bilateral breasts. No nipple retraction bilateral breasts. No nipple discharge bilateral breasts. No lymphadenopathy. No lumps palpated bilateral breasts. No complaints of pain or tenderness on exam.     MS DIGITAL DIAG TOMO BILAT Result Date: 02/19/2022 CLINICAL DATA:  48 year old female with 2 episodes of milky right nipple discharge only when expressed. EXAM: DIGITAL DIAGNOSTIC BILATERAL MAMMOGRAM WITH TOMOSYNTHESIS; ULTRASOUND RIGHT BREAST LIMITED TECHNIQUE: Bilateral digital diagnostic mammography and breast tomosynthesis was performed.; Targeted ultrasound examination of the right breast was performed COMPARISON:  Previous exam(s). ACR Breast Density Category c: The breast tissue is heterogeneously dense, which may obscure small masses. FINDINGS: There are no focal or suspicious mammographic findings in either breast. The parenchymal pattern is stable. Targeted ultrasound is performed, showing no focal or suspicious sonographic finding in the subareolar right breast. IMPRESSION: 1. No mammographic evidence of malignancy in either breast. 2. Unremarkable ultrasound evaluation of the subareolar right breast. RECOMMENDATION: 1. Causes of unilateral nipple discharge include: Hormonal changes, fibrocystic changes, benign papilloma, abscess/mastitis, birth control pills, endocrine disorders,  injury/trauma to breast, duct ectasia, medications, prolactinoma, and breast cancer. As is evident from this list, nipple discharge often stems from a benign condition. However, breast cancer is a possibility when unilateral spontaneous persistent single duct discharge (especially bloody or clear discharge) is present. 2.  Screening mammogram in one year.(Code:SM-B-01Y) I have discussed the findings and recommendations with the patient. If applicable, a reminder letter will be sent to the patient regarding the next appointment. BI-RADS CATEGORY  1: Negative. Electronically Signed   By: Sande Brothers M.D.   On: 02/19/2022 16:06   MM 3D SCREEN BREAST BILATERAL Result Date: 01/09/2021 CLINICAL DATA:  Screening. EXAM: DIGITAL SCREENING BILATERAL MAMMOGRAM WITH TOMOSYNTHESIS AND CAD TECHNIQUE: Bilateral screening digital craniocaudal and mediolateral oblique mammograms were obtained. Bilateral screening digital breast tomosynthesis was performed. The images were evaluated with computer-aided detection. COMPARISON:  Previous exam(s). ACR Breast Density Category c: The breast tissue is heterogeneously dense, which may obscure small masses. FINDINGS: There are no findings suspicious for malignancy. IMPRESSION: No mammographic evidence of malignancy. A result letter of this screening mammogram will be mailed directly to the patient. RECOMMENDATION: Screening mammogram in one year. (Code:SM-B-01Y) BI-RADS CATEGORY  1: Negative. Electronically Signed   By: Amie Portland M.D.   On: 01/09/2021 13:42   MS DIGITAL SCREENING TOMO BILATERAL Result Date: 10/28/2019 CLINICAL DATA:  Screening. EXAM: DIGITAL SCREENING BILATERAL MAMMOGRAM WITH TOMO AND CAD COMPARISON:  Previous exam(s). ACR Breast Density Category b: There are scattered areas of fibroglandular density. FINDINGS: There are no findings suspicious for malignancy. Images were processed with CAD. IMPRESSION: No mammographic evidence of malignancy. A result letter of  this screening mammogram will be mailed directly to the patient. RECOMMENDATION: Screening mammogram in one year. (Code:SM-B-01Y) BI-RADS CATEGORY  1: Negative. Electronically Signed   By: Hulan Saas M.D.   On:  10/28/2019 08:40   MS DIGITAL DIAG TOMO UNI LEFT Result Date: 06/18/2018 CLINICAL DATA:  48 year old female recalled from baseline screening mammogram dated 06/04/2018 for a possible left breast mass. EXAM: DIGITAL DIAGNOSTIC UNILATERAL LEFT MAMMOGRAM WITH CAD AND TOMO COMPARISON:  Previous exam(s). ACR Breast Density Category c: The breast tissue is heterogeneously dense, which may obscure small masses. FINDINGS: Previously described, possible mass in the central medial left breast at mid to posterior depth resolves into well dispersed fibroglandular tissue on today's additional views. No suspicious findings are identified. Mammographic images were processed with CAD. IMPRESSION: No mammographic evidence of malignancy. RECOMMENDATION: Screening mammogram in one year.(Code:SM-B-01Y) I have discussed the findings and recommendations with the patient. Results were also provided in writing at the conclusion of the visit. If applicable, a reminder letter will be sent to the patient regarding the next appointment. BI-RADS CATEGORY  1: Negative. Electronically Signed   By: Sande Brothers M.D.   On: 06/18/2018 09:55   MS DIGITAL SCREENING TOMO BILATERAL Addendum Date: 06/05/2018 ADDENDUM REPORT: 06/05/2018 11:23 ADDENDUM: An addendum is made to the report due to a typo. The corrections are as below: Comparison: none Breast density: b: There are scattered areas of fibroglandular density. The remainder of the report is correct. Electronically Signed   By: Edwin Cap M.D.   On: 06/05/2018 11:23   Result Date: 06/05/2018 CLINICAL DATA:  Screening. EXAM: DIGITAL SCREENING BILATERAL MAMMOGRAM WITH TOMO AND CAD COMPARISON:  Previous exam(s). ACR Breast Density Category none FINDINGS: In the left breast, a  possible mass warrants further evaluation. In the right breast, no findings suspicious for malignancy. Images were processed with CAD. IMPRESSION: Further evaluation is suggested for possible mass in the left breast. RECOMMENDATION: Diagnostic mammogram and possibly ultrasound of the left breast. (Code:[id number]-45M) The patient will be contacted regarding the findings, and additional imaging will be scheduled. BI-RADS CATEGORY  0: Incomplete. Need additional imaging evaluation and/or prior mammograms for comparison. Electronically Signed: By: Edwin Cap M.D. On: 06/05/2018 10:51   Pelvic/Bimanual Pap is not indicated today per BCCCP guidelines.   Smoking History: Patient has never smoked.   Patient Navigation: Patient education provided. Access to services provided for patient through [facility] program. Spanish interpreter [name] from [facility] provided.   Colorectal Cancer Screening: Per patient has never had colonoscopy completed. FIT Test given to patient to complete. No complaints today.    Breast and Cervical Cancer Risk Assessment: Patient does not have family history of breast cancer, known genetic mutations, or radiation treatment to the chest before age 55. Patient does not have history of cervical dysplasia, immunocompromised, or DES exposure in-utero.  Risk Scores as of Encounter on 05/20/2023     Dondra Spry           5-year 0.66%   Lifetime 7.9%            Last calculated by Caprice Red, CMA on 05/20/2023 at  8:34 AM        A: BCCCP exam without pap smear No complaints.  P: Referred patient to the [facility] for a screening mammogram on mobile unit. Appointment scheduled Tuesday, May 20, 2023 at 0920.  Priscille Heidelberg, RN 05/20/2023 9:31 AM

## 2023-07-09 LAB — FECAL OCCULT BLOOD, IMMUNOCHEMICAL: Fecal Occult Bld: NEGATIVE

## 2023-09-15 ENCOUNTER — Other Ambulatory Visit: Payer: Self-pay

## 2023-11-25 NOTE — Progress Notes (Signed)
 PAP records requested from [facility].

## 2023-12-30 ENCOUNTER — Inpatient Hospital Stay: Attending: Obstetrics and Gynecology | Admitting: *Deleted

## 2023-12-30 ENCOUNTER — Other Ambulatory Visit: Payer: Self-pay

## 2023-12-30 ENCOUNTER — Other Ambulatory Visit: Payer: Self-pay | Admitting: *Deleted

## 2023-12-30 VITALS — BP 124/83 | Ht 63.0 in | Wt 132.0 lb

## 2023-12-30 DIAGNOSIS — Z0000 Encounter for general adult medical examination without abnormal findings: Secondary | ICD-10-CM

## 2023-12-30 NOTE — Progress Notes (Signed)
 [facility] initial screening      Clinical Measurement:  Vitals:   12/30/23 1022 12/30/23 1033  BP: 112/80 124/83   Fasting Labs Drawn Today, will review with patient when they result.   Medical History: Patient states that she does not have high cholesterol, does not have high blood pressure and she does not have diabetes. Patient states that she does not have history of gestational hypertension, does not have history of pre-eclampsia/eclampsia and she does not have history of gestational diabetes.    Medications: Patient states that she does not take medication to lower cholesterol, blood pressure or blood sugar.  Patient does not take an aspirin a day to help prevent a heart attack or stroke.   Blood pressure, self measurement: Patient states that she does not measure blood pressure from home. She checks her blood pressure N/A. She shares her readings with a health care provider: N/A.   Nutrition: Patient states that on average she eats 1-2 cups of fruit and 1-2 cups of vegetables per day. Patient states that she does not eat fish at least 2 times per week. Patient eats about half servings of whole grains. Patient drinks less than 36 ounces of beverages with added sugar weekly: yes. Patient is currently watching sodium or salt intake: yes. In the past 7 days patient has consumed drinks containing alcohol on 0 days. On a day that patient consumes drinks containing alcohol on average 0 drinks are consumed.      Physical activity: Patient states that she gets 0 minutes of physical activity each week.  Smoking status: Patient states that she has has never smoked .   Quality of life: Over the past 2 weeks patient states that she had little interest or pleasure in doing things: not at all. She has been feeling down, depressed or hopeless:not at all.   Social Determinants of Health Assessment:   Computer Use: During the last 12 months patient states that she has used any of the following:  desktop/laptop, smart phone or tablet/other portable wireless computer: yes.   Internet Use: During the last 12 months, did you or any member of your household have access to the internet: Yes, by paying a cell phone company or internet service provider.   Food Insecurities: During the last 12 months, where there any times when you were worried that you would run out of food because of a lack of money or other resources: No.   Transportation Barriers: During the last 12 months, have you missed a doctor's appointment because of transportation problems: No.   Childcare Barriers: If you are currently using childcare services, please identify  the type of services you use. (If not using childcare services, please select Not applicable): not applicable. During the last 12 months, have you had any barriers to childcare services such as: not applicable.   Housing: What is your housing situation today: I have housing.   Intimate Partner Violence: During the last 12 months, how often did your partner physically hurt you: never. During the last 12 months, how often did your partner insult you or talk down to you: never.  Medication Adherence: During the last 12 months, did you ever forget to take your medicine: not applicable. During the last 12 months, were you careless ar times about taking your medicine: not applicable. During the last 12 months, when you felt better did you sometimes stop taking your medication: not applicable. During the last 12 months, sometimes if you felt worse when you  took your medicine did you stop taking it: not applicable.   Risk reduction and counseling:   Health Coaching: Spoke with patient about the daily recommendations for fruits and vegetables. Showed patient what a serving size would look like. Patient consumes whole wheat bread and oatmeal regularly. Explained to patient how whole grains are part of a hear healthy diet and can help with keeping cholesterol numbers  down. Patient consumes fish at least one time per week. Patient has cut out sodas from her diet. Patient states that she has not been exercising recently but wants to start walking again See exercise based goal below.   Goal: Patient will start walking 2-3 days per week for at least 20 minutes. Patient will work on reaching this goal over the next month. Once first goal is reached patient can increase to 4-5 days per week.   Navigation:  I will notify patient of lab results.  Patient is aware of 2 more health coaching sessions and a follow up.

## 2023-12-30 NOTE — Progress Notes (Signed)
 Pap smear not needed today. Patients last Pap smear was 05/14/2023 at the [facility] Department and normal with negative HPV per Therisa Bras, RN at the Health Department. Per patient has no history of an abnormal Pap smear. Next Pap smear is due in 3 years per the [facility] Department due to endocervical cells were absent on Pap smear. Patient informed and verbalized understanding.

## 2023-12-31 ENCOUNTER — Ambulatory Visit

## 2023-12-31 ENCOUNTER — Other Ambulatory Visit

## 2024-01-01 LAB — LIPID PANEL
Chol/HDL Ratio: 3 ratio (ref 0.0–4.4)
Cholesterol, Total: 160 mg/dL (ref 100–199)
HDL: 53 mg/dL (ref >39)
LDL Chol Calc (NIH): 77 mg/dL (ref 0–99)
Triglycerides: 175 mg/dL — ABNORMAL HIGH (ref 0–149)
VLDL Cholesterol Cal: 30 mg/dL (ref 5–40)

## 2024-01-01 LAB — GLUCOSE, RANDOM: Glucose: 97 mg/dL (ref 70–99)

## 2024-01-01 LAB — HEMOGLOBIN A1C
Est. average glucose Bld gHb Est-mCnc: 114 mg/dL
Hgb A1c MFr Bld: 5.6 % (ref 4.8–5.6)

## 2024-01-05 ENCOUNTER — Ambulatory Visit: Payer: Self-pay

## 2024-01-05 NOTE — Telephone Encounter (Signed)
 Health coaching 2   interpreter- Bernice Angry, [employer]   Labs- 160 cholesterol, 77 LDL cholesterol, 53 HDL cholesterol, 175 triglycerides, 5.6 hemoglobin A1C, 97 mean plasma glucose. Patient understands and is aware of her lab results.   Goals-  1. Daily exercise for at least 20 minutes (walking). 2. Watch the amount of processed foods consumed. 3. Watch the amount of sweet and sugary foods and drinks consumed. Watch the amount of carb rich foods consumed.   Navigation:  Patient is aware of 1 more health coaching sessions and a follow up.

## 2024-01-08 ENCOUNTER — Other Ambulatory Visit: Payer: Self-pay

## 2024-04-19 ENCOUNTER — Ambulatory Visit (INDEPENDENT_AMBULATORY_CARE_PROVIDER_SITE_OTHER): Payer: Self-pay | Admitting: Family Medicine

## 2024-04-19 ENCOUNTER — Encounter: Payer: Self-pay | Admitting: Family Medicine

## 2024-04-19 VITALS — BP 110/71 | HR 70 | Ht 63.0 in | Wt 129.4 lb

## 2024-04-19 DIAGNOSIS — H04123 Dry eye syndrome of bilateral lacrimal glands: Secondary | ICD-10-CM

## 2024-04-19 MED ORDER — CARBOXYMETHYLCELLULOSE SODIUM 1 % OP SOLN: 1.0000 [drp] | Freq: Three times a day (TID) | OPHTHALMIC | 12 refills | Status: AC

## 2024-04-19 MED ORDER — FLUTICASONE PROPIONATE 50 MCG/ACT NA SUSP: 1.0000 | Freq: Every day | NASAL | 0 refills | Status: AC

## 2024-04-19 NOTE — Progress Notes (Signed)
" ° ° °  SUBJECTIVE:   CHIEF COMPLAINT / HPI:   1 week history of dry/itchy eyes and a little bit of puffiness around her eyelids Started in left eye now experiencing symptoms in right eye No sick contacts, no exposure to anybody with pinkeye No fevers, eye swelling Has had some increased crusting when she wakes up in the mornings but mainly in the corner of her eye and not across the entire eyelid.  Denies any significant purulence or drainage.  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  No significant injection, drainage, bleeding.  No obvious foreign body noted.  She does have some mild periorbital puffiness.  Nontender.  No significant erythema or lesions or wounds. Respiratory: No respiratory distress Skin: warm and dry, no rashes noted Psych: Normal affect and mood  Vision Screening   Right eye Left eye Both eyes  Without correction     With correction 20/20 20/25 20/20      ASSESSMENT/PLAN:     Assessment & Plan Bilateral dry eyes Lower suspicion for bacterial etiology Possibly viral given concurrent report of some congestion Possibly allergic given bilateral symptoms however has not responded to Pataday   Recommend trial of artificial tears in addition to Flonase .  Sent Rx for both Also possible this will resolve on its own Discussed return precautions  and signs of bacterial conjunctivitis Her visual acuity is normal today, discussed precautions should this worsen  If persistent symptoms would recommend return for fluorescein eye exam   Payton Coward, MD [facility] "

## 2024-04-19 NOTE — Patient Instructions (Addendum)
 Try using Refresh (artificial tears) eye drops three times daily  Also use flonase  twice daily to help clear sinuses  Let me know if you develop worsening crusting of the eyes, drainage, fevers, loss of vision
